# Patient Record
Sex: Female | Born: 1990 | Hispanic: Yes | Marital: Single | Smoking: Never smoker
Health system: Southern US, Community
[De-identification: ages and names within clinical notes are randomized; demographics above are authoritative.]

## PROBLEM LIST (undated history)

## (undated) DIAGNOSIS — E039 Hypothyroidism, unspecified: Secondary | ICD-10-CM

## (undated) DIAGNOSIS — D649 Anemia, unspecified: Secondary | ICD-10-CM

## (undated) DIAGNOSIS — R87629 Unspecified abnormal cytological findings in specimens from vagina: Secondary | ICD-10-CM

## (undated) DIAGNOSIS — B009 Herpesviral infection, unspecified: Secondary | ICD-10-CM

## (undated) HISTORY — PX: WISDOM TOOTH EXTRACTION: SHX21

## (undated) HISTORY — PX: TONSILLECTOMY: SUR1361

## (undated) HISTORY — DX: Unspecified abnormal cytological findings in specimens from vagina: R87.629

---

## 2012-08-18 ENCOUNTER — Encounter (HOSPITAL_COMMUNITY): Payer: Self-pay | Admitting: Nurse Practitioner

## 2012-08-18 ENCOUNTER — Emergency Department (HOSPITAL_COMMUNITY)
Admission: EM | Admit: 2012-08-18 | Discharge: 2012-08-18 | Disposition: A | Discharge status: Home / Self Care | Payer: BC Managed Care – PPO | Attending: Emergency Medicine | Admitting: Emergency Medicine

## 2012-08-18 ENCOUNTER — Emergency Department (HOSPITAL_COMMUNITY): Payer: BC Managed Care – PPO

## 2012-08-18 DIAGNOSIS — N949 Unspecified condition associated with female genital organs and menstrual cycle: Secondary | ICD-10-CM | POA: Insufficient documentation

## 2012-08-18 DIAGNOSIS — R102 Pelvic and perineal pain: Secondary | ICD-10-CM

## 2012-08-18 DIAGNOSIS — R300 Dysuria: Secondary | ICD-10-CM | POA: Insufficient documentation

## 2012-08-18 DIAGNOSIS — Z3202 Encounter for pregnancy test, result negative: Secondary | ICD-10-CM | POA: Insufficient documentation

## 2012-08-18 DIAGNOSIS — N898 Other specified noninflammatory disorders of vagina: Secondary | ICD-10-CM | POA: Insufficient documentation

## 2012-08-18 DIAGNOSIS — R109 Unspecified abdominal pain: Secondary | ICD-10-CM | POA: Insufficient documentation

## 2012-08-18 LAB — WET PREP, GENITAL
Clue Cells Wet Prep HPF POC: NONE SEEN
Trich, Wet Prep: NONE SEEN
Yeast Wet Prep HPF POC: NONE SEEN

## 2012-08-18 LAB — URINALYSIS, ROUTINE W REFLEX MICROSCOPIC
Glucose, UA: NEGATIVE mg/dL
Specific Gravity, Urine: 1.02 (ref 1.005–1.030)
pH: 5.5 (ref 5.0–8.0)

## 2012-08-18 LAB — URINE MICROSCOPIC-ADD ON

## 2012-08-18 LAB — PREGNANCY, URINE: Preg Test, Ur: NEGATIVE

## 2012-08-18 MED ORDER — HYDROCODONE-ACETAMINOPHEN 5-325 MG PO TABS: 2.0000 | ORAL_TABLET | ORAL | Status: DC | PRN

## 2012-08-18 MED ORDER — AZITHROMYCIN 250 MG PO TABS
1000.0000 mg | ORAL_TABLET | Freq: Once | ORAL | Status: AC
  Administered 2012-08-18: 1000 mg via ORAL
  Filled 2012-08-18: qty 4

## 2012-08-18 MED ORDER — CEFTRIAXONE SODIUM 250 MG IJ SOLR
250.0000 mg | Freq: Once | INTRAMUSCULAR | Status: AC
  Administered 2012-08-18: 250 mg via INTRAMUSCULAR
  Filled 2012-08-18: qty 250

## 2012-08-18 MED ORDER — ACETAMINOPHEN 325 MG PO TABS
650.0000 mg | ORAL_TABLET | Freq: Once | ORAL | Status: AC
  Administered 2012-08-18: 650 mg via ORAL
  Filled 2012-08-18: qty 2

## 2012-08-18 MED ORDER — IBUPROFEN 400 MG PO TABS
400.0000 mg | ORAL_TABLET | Freq: Once | ORAL | Status: AC
  Administered 2012-08-18: 400 mg via ORAL
  Filled 2012-08-18: qty 1

## 2012-08-18 NOTE — ED Notes (Addendum)
Pt does not appear to have signs and symptoms of allergic reaction to medication given.

## 2012-08-18 NOTE — ED Notes (Signed)
Pt denies pain currently; pt denies nausea; pt mentating appropriately.

## 2012-08-18 NOTE — ED Provider Notes (Signed)
History     CSN: [id number]  Arrival date & time 08/18/12  1554   First MD Initiated Contact with Patient 08/18/12 1726      Chief Complaint  Patient presents with  . Pelvic Pain    (Consider location/radiation/quality/duration/timing/severity/associated sxs/prior treatment) Patient is a 22 y.o. female presenting with pelvic pain. The history is provided by the patient.  Pelvic Pain This is a new problem. Episode onset: 3 days ago. The problem occurs constantly. The problem has been gradually worsening. Associated symptoms include abdominal pain. Associated symptoms comments: Vaginal discharge, dysuria. The symptoms are aggravated by intercourse and walking. Nothing relieves the symptoms. She has tried acetaminophen for the symptoms. The treatment provided no relief.    History reviewed. No pertinent past medical history.  Past Surgical History  Procedure Date  . Tonsillectomy     History reviewed. No pertinent family history.  History  Substance Use Topics  . Smoking status: Never Smoker   . Smokeless tobacco: Not on file  . Alcohol Use: Yes    OB History    Grav Para Term Preterm Abortions TAB SAB Ect Mult Living                  Review of Systems  Constitutional: Negative for fever.  Gastrointestinal: Positive for abdominal pain. Negative for nausea and vomiting.  Genitourinary: Positive for pelvic pain.  All other systems reviewed and are negative.    Allergies  Review of patient's allergies indicates no known allergies.  Home Medications  No current outpatient prescriptions on file.  BP 108/72  Pulse 85  Temp 98 F (36.7 C) (Oral)  Resp 16  SpO2 100%  LMP 08/05/2012  Physical Exam  Nursing note and vitals reviewed. Constitutional: She is oriented to person, place, and time. She appears well-developed and well-nourished. No distress.  HENT:  Head: Normocephalic and atraumatic.  Mouth/Throat: Oropharynx is clear and moist.  Eyes: Conjunctivae  normal and EOM are normal. Pupils are equal, round, and reactive to light.  Neck: Normal range of motion. Neck supple.  Cardiovascular: Normal rate, regular rhythm and intact distal pulses.   No murmur heard. Pulmonary/Chest: Effort normal and breath sounds normal. No respiratory distress. She has no wheezes. She has no rales.  Abdominal: Soft. She exhibits no distension. There is tenderness in the suprapubic area. There is no rebound and no guarding.         Bilateral inguinal lymphadenopathy  Genitourinary: Uterus is tender. Cervix exhibits discharge and friability. Cervix exhibits no motion tenderness. Right adnexum displays tenderness. Right adnexum displays no mass. Left adnexum displays tenderness. Left adnexum displays no mass. There is tenderness around the vagina. Vaginal discharge found.  Musculoskeletal: Normal range of motion. She exhibits no edema and no tenderness.  Neurological: She is alert and oriented to person, place, and time.  Skin: Skin is warm and dry. No rash noted. No erythema.  Psychiatric: She has a normal mood and affect. Her behavior is normal.    ED Course  Procedures (including critical care time)  Labs Reviewed  URINALYSIS, ROUTINE W REFLEX MICROSCOPIC - Abnormal; Notable for the following:    APPearance CLOUDY (*)     Hgb urine dipstick SMALL (*)     Ketones, ur 15 (*)     Leukocytes, UA LARGE (*)     All other components within normal limits  WET PREP, GENITAL - Abnormal; Notable for the following:    WBC, Wet Prep HPF POC MODERATE (*)  All other components within normal limits  URINE MICROSCOPIC-ADD ON - Abnormal; Notable for the following:    Squamous Epithelial / LPF FEW (*)     Bacteria, UA MANY (*)     All other components within normal limits  PREGNANCY, URINE  GC/CHLAMYDIA PROBE AMP  URINE CULTURE   No results found.   No diagnosis found.    MDM  Patient with pelvic pain, discharge and more focal pain on the left side. She also  is complaining of dysuria. Normal menses and is currently taking birth control pills. She has unprotected sex with one partner and no prior history of STDs. UA, wet prep, UPT and GC Chlamydia done. Concern for possible tubo-ovarian abscess versus ovarian cyst. Transvaginal ultrasound pending.  7:06 PM Moderate white blood cells with large leukocytes and a contaminated urine are most suggestive of STI.  Ultrasound pending. Patient will be treated with doxy if [place] is wnl.      Gwyneth Sprout, MD 08/18/12 2001

## 2012-08-18 NOTE — ED Notes (Signed)
Pt ambulatory leaving ED by self. Pt given d/c teaching and prescription. Pt verbalized understanding of instructions and follow-up care. Pt has no further questions upon d/c. Pt does not appear to be in acute distress upon d/c.

## 2012-08-18 NOTE — ED Provider Notes (Addendum)
Patient complains of pelvic pain and vaginal discharge. We'll treat empirically for STDs. Cultures pending. Prescription Norco. Followup with gynecologist next week. Safe sex encouraged Results for orders placed during the hospital encounter of 08/18/12  URINALYSIS, ROUTINE W REFLEX MICROSCOPIC      Component Value Range   Color, Urine YELLOW  YELLOW   APPearance CLOUDY (*) CLEAR   Specific Gravity, Urine 1.020  1.005 - 1.030   pH 5.5  5.0 - 8.0   Glucose, UA NEGATIVE  NEGATIVE mg/dL   Hgb urine dipstick SMALL (*) NEGATIVE   Bilirubin Urine NEGATIVE  NEGATIVE   Ketones, ur 15 (*) NEGATIVE mg/dL   Protein, ur NEGATIVE  NEGATIVE mg/dL   Urobilinogen, UA 0.2  0.0 - 1.0 mg/dL   Nitrite NEGATIVE  NEGATIVE   Leukocytes, UA LARGE (*) NEGATIVE  PREGNANCY, URINE      Component Value Range   Preg Test, Ur NEGATIVE  NEGATIVE  WET PREP, GENITAL      Component Value Range   Yeast Wet Prep HPF POC NONE SEEN  NONE SEEN   Trich, Wet Prep NONE SEEN  NONE SEEN   Clue Cells Wet Prep HPF POC NONE SEEN  NONE SEEN   WBC, Wet Prep HPF POC MODERATE (*) NONE SEEN  URINE MICROSCOPIC-ADD ON      Component Value Range   Squamous Epithelial / LPF FEW (*) RARE   WBC, UA 11-20  <3 WBC/hpf   RBC / HPF 3-6  <3 RBC/hpf   Bacteria, UA MANY (*) RARE   US Transvaginal Non-ob  08/18/2012  *RADIOLOGY REPORT*  Clinical Data:  Bilateral pelvic pain.  Clinical concern for a cyst or tubovarian abscess on the left.  TRANSABDOMINAL AND TRANSVAGINAL ULTRASOUND OF PELVIS DOPPLER ULTRASOUND OF OVARIES  Technique:  Both transabdominal and transvaginal ultrasound examinations of the pelvis were performed. Transabdominal technique was performed for global imaging of the pelvis including uterus, ovaries, adnexal regions, and pelvic cul-de-sac.  It was necessary to proceed with endovaginal exam following the transabdominal exam to visualize the right ovary and to visualize the uterus and left ovary in better detail.  Color and duplex  Doppler ultrasound was utilized to evaluate blood flow to the ovaries.  Comparison:  None.  Findings:  Uterus:  Normal in size and appearance  Endometrium:  Normal in thickness and appearance  Right ovary: Normal appearance/no adnexal mass  Left ovary:   Normal appearance/no adnexal mass  Pulsed Doppler evaluation demonstrates normal low-resistance arterial and venous waveforms in both ovaries.  Trace amount of free peritoneal fluid, within normal limits of physiological fluid.  IMPRESSION: Normal exam.  No evidence of pelvic mass or other significant abnormality.  No sonographic evidence for ovarian torsion.   Original Report Authenticated By: Beckie Salts, M.D.    US Pelvis Complete  08/18/2012  *RADIOLOGY REPORT*  Clinical Data:  Bilateral pelvic pain.  Clinical concern for a cyst or tubovarian abscess on the left.  TRANSABDOMINAL AND TRANSVAGINAL ULTRASOUND OF PELVIS DOPPLER ULTRASOUND OF OVARIES  Technique:  Both transabdominal and transvaginal ultrasound examinations of the pelvis were performed. Transabdominal technique was performed for global imaging of the pelvis including uterus, ovaries, adnexal regions, and pelvic cul-de-sac.  It was necessary to proceed with endovaginal exam following the transabdominal exam to visualize the right ovary and to visualize the uterus and left ovary in better detail.  Color and duplex Doppler ultrasound was utilized to evaluate blood flow to the ovaries.  Comparison:  None.  Findings:  Uterus:  Normal in size and appearance  Endometrium:  Normal in thickness and appearance  Right ovary: Normal appearance/no adnexal mass  Left ovary:   Normal appearance/no adnexal mass  Pulsed Doppler evaluation demonstrates normal low-resistance arterial and venous waveforms in both ovaries.  Trace amount of free peritoneal fluid, within normal limits of physiological fluid.  IMPRESSION: Normal exam.  No evidence of pelvic mass or other significant abnormality.  No sonographic evidence  for ovarian torsion.   Original Report Authenticated By: Beckie Salts, M.D.    [place] Art/ven Flow Abd Pelv Doppler  08/18/2012  *RADIOLOGY REPORT*  Clinical Data:  Bilateral pelvic pain.  Clinical concern for a cyst or tubovarian abscess on the left.  TRANSABDOMINAL AND TRANSVAGINAL ULTRASOUND OF PELVIS DOPPLER ULTRASOUND OF OVARIES  Technique:  Both transabdominal and transvaginal ultrasound examinations of the pelvis were performed. Transabdominal technique was performed for global imaging of the pelvis including uterus, ovaries, adnexal regions, and pelvic cul-de-sac.  It was necessary to proceed with endovaginal exam following the transabdominal exam to visualize the right ovary and to visualize the uterus and left ovary in better detail.  Color and duplex Doppler ultrasound was utilized to evaluate blood flow to the ovaries.  Comparison:  None.  Findings:  Uterus:  Normal in size and appearance  Endometrium:  Normal in thickness and appearance  Right ovary: Normal appearance/no adnexal mass  Left ovary:   Normal appearance/no adnexal mass  Pulsed Doppler evaluation demonstrates normal low-resistance arterial and venous waveforms in both ovaries.  Trace amount of free peritoneal fluid, within normal limits of physiological fluid.  IMPRESSION: Normal exam.  No evidence of pelvic mass or other significant abnormality.  No sonographic evidence for ovarian torsion.   Original Report Authenticated By: Beckie Salts, M.D.    Dx pelvic pain  Doug Sou, MD 08/18/12 7829  Doug Sou, MD 08/18/12 2134

## 2012-08-18 NOTE — ED Notes (Signed)
C/o mild pelvic pain onset Thursday that has become severe today. Denies n/v. Reports thick white vag discharge.

## 2012-08-20 LAB — URINE CULTURE: Colony Count: >=100000

## 2012-08-25 NOTE — ED Notes (Signed)
+   Urine Chart sent to EDP office for review. 

## 2012-08-25 NOTE — ED Notes (Signed)
Rx called in to [facility] [phone]) by Norm Parcel PFM.

## 2012-08-25 NOTE — ED Notes (Signed)
Chart returned from EDP office. Prescribed Cipro 250 mg PO BID. #6. No refills. Prescribed by Hannah Muthersbaugh PA-C. °

## 2013-01-17 LAB — OB RESULTS CONSOLE RUBELLA ANTIBODY, IGM: RUBELLA: IMMUNE

## 2013-01-17 LAB — OB RESULTS CONSOLE ABO/RH: RH Type: POSITIVE

## 2013-01-17 LAB — OB RESULTS CONSOLE HEPATITIS B SURFACE ANTIGEN: HEP B S AG: NEGATIVE

## 2013-02-06 LAB — OB RESULTS CONSOLE HIV ANTIBODY (ROUTINE TESTING)
HIV: NONREACTIVE
HIV: NONREACTIVE

## 2013-02-06 LAB — OB RESULTS CONSOLE RPR: RPR: NONREACTIVE

## 2013-02-06 LAB — OB RESULTS CONSOLE ABO/RH: RH TYPE: POSITIVE

## 2013-02-06 LAB — OB RESULTS CONSOLE HEPATITIS B SURFACE ANTIGEN: HEP B S AG: NEGATIVE

## 2013-02-06 LAB — OB RESULTS CONSOLE RUBELLA ANTIBODY, IGM: Rubella: IMMUNE

## 2013-02-06 LAB — OB RESULTS CONSOLE ANTIBODY SCREEN: Antibody Screen: NEGATIVE

## 2013-02-13 LAB — OB RESULTS CONSOLE GC/CHLAMYDIA
CHLAMYDIA, DNA PROBE: NEGATIVE
Chlamydia: NEGATIVE
Gonorrhea: NEGATIVE
Gonorrhea: NEGATIVE

## 2013-06-17 LAB — OB RESULTS CONSOLE RPR: RPR: NONREACTIVE

## 2013-08-15 NOTE — L&D Delivery Note (Signed)
Delivery Note At 11:50 PM a viable female was delivered via Vaginal, Spontaneous Delivery (Presentation: ; Occiput Anterior).  APGAR: 9, 9; weight .   Placenta status: Intact, Spontaneous.  Cord: 3 vessels with the following complications: None.  Cord pH: not indicated  Anesthesia: Epidural  Episiotomy: None Lacerations: small second degree perineal, external to hymenal band repaired w/ several figure of 8 sutures of 3-0 vicryl rapide and L sulcal w/ extension to L labia minora Suture Repair: 3.0 vicryl rapide Est. Blood Loss (mL): 450  Mom to postpartum.  Baby to Couplet care / Skin to Skin.  Alanna Storti A. 09/09/2013, 12:22 AM

## 2013-08-18 LAB — OB RESULTS CONSOLE GC/CHLAMYDIA
Chlamydia: NEGATIVE
GC PROBE AMP, GENITAL: NEGATIVE

## 2013-08-23 ENCOUNTER — Inpatient Hospital Stay (HOSPITAL_COMMUNITY)
Admission: AD | Admit: 2013-08-23 | Discharge: 2013-08-23 | Disposition: A | Discharge status: Home / Self Care | Payer: PRIVATE HEALTH INSURANCE | Source: Ambulatory Visit | Attending: Obstetrics & Gynecology | Admitting: Obstetrics & Gynecology

## 2013-08-23 ENCOUNTER — Encounter (HOSPITAL_COMMUNITY): Payer: Self-pay | Admitting: *Deleted

## 2013-08-23 DIAGNOSIS — O2390 Unspecified genitourinary tract infection in pregnancy, unspecified trimester: Secondary | ICD-10-CM | POA: Insufficient documentation

## 2013-08-23 DIAGNOSIS — B3731 Acute candidiasis of vulva and vagina: Secondary | ICD-10-CM | POA: Insufficient documentation

## 2013-08-23 DIAGNOSIS — O4700 False labor before 37 completed weeks of gestation, unspecified trimester: Secondary | ICD-10-CM | POA: Insufficient documentation

## 2013-08-23 DIAGNOSIS — B373 Candidiasis of vulva and vagina: Secondary | ICD-10-CM | POA: Insufficient documentation

## 2013-08-23 DIAGNOSIS — R109 Unspecified abdominal pain: Secondary | ICD-10-CM | POA: Insufficient documentation

## 2013-08-23 DIAGNOSIS — O479 False labor, unspecified: Secondary | ICD-10-CM

## 2013-08-23 HISTORY — DX: Herpesviral infection, unspecified: B00.9

## 2013-08-23 MED ORDER — TERCONAZOLE 0.4 % VA CREA: 1.0000 | TOPICAL_CREAM | Freq: Every day | VAGINAL | Status: DC

## 2013-08-23 NOTE — MAU Provider Note (Signed)
  History     CSN: [id number]  Arrival date and time: 08/23/13 1820 Nurse call to provider @1918  Provider here to examine patient @ 2005     Chief Complaint  Patient presents with  . Labor Eval   HPI  Cramping - ctx every 2 minutes last few seconds Leaking fluid -discharge Denies any vaginal itching or odor Constant pressure  Past Medical History  Diagnosis Date  . Herpes     Past Surgical History  Procedure Laterality Date  . Tonsillectomy    . Wisdom tooth extraction      History reviewed. No pertinent family history.  History  Substance Use Topics  . Smoking status: Never Smoker   . Smokeless tobacco: Not on file  . Alcohol Use: Yes    Allergies: No Known Allergies  Prescriptions prior to admission  Medication Sig Dispense Refill  . IRON PO Take 1 tablet by mouth daily.      . Prenatal Vit-Fe Fumarate-FA (PRENATAL MULTIVITAMIN) TABS tablet Take 1 tablet by mouth daily.      . valACYclovir (VALTREX) 500 MG tablet Take 500 mg by mouth every evening.        ROS Physical Exam   Blood pressure 110/69, pulse 87, temperature 97.9 F (36.6 C), temperature source Oral, resp. rate 18, height 5\' 5"  (1.651 m), weight 83.008 kg (183 lb).  Physical Exam Alert and oriented - NAD or pain Abdomen soft and non-tender External genitalia with marked amount thick white discharge Sterile spec exam - no pooling / marked amount white discharge  Ferning negative Hyphae on slide  NST reactive  toco - no ctx  MAU Course  Procedures  Assessment and Plan  36.[redacted] weeks pregnant No evidence of ROM or labor yeast vaginitis  1) dc home - instructions to always call before trip to hospital 2) labor precautions - ctx versus cramping / vaginal discharge versus amniotic fluid with ROM 3) treat yeast with 7 days of Terazol cream  [name], [name] 08/23/2013, 8:12 PM

## 2013-08-23 NOTE — MAU Note (Signed)
Was at work, started feeling a lot pressure and cramping. Noted ? Trickling when menstrual cramping started around 1530.

## 2013-08-23 NOTE — Discharge Instructions (Signed)
Braxton Hicks Contractions Pregnancy is commonly associated with contractions of the uterus throughout the pregnancy. Towards the end of pregnancy, these contractions [facility](Braxton Willa Rough[place]icks) can develop more often and may become more forceful. This is not true labor because these contractions do not result in opening (dilatation) and thinning of the cervix.   How to tell the difference between true and false labor: False labor:  The contractions are usually shorter, irregular and not as hard as those of true labor.  They are often felt in the front of the lower abdomen and in the groin.  They may stop with walking around or changing positions or lying down or increased water intake or tub soaks.  These contractions are usually irregular and will eventually space out and stop.  They do not become progressively stronger, regular and closer together.   True labor:  Contractions in true labor last 60 to 90 seconds, become very regular, usually become more intense, and increase in frequency over time.  They do not go away with walking or resting or tub soaks.  The discomfort is usually felt in the top of the uterus and spreads to the lower abdomen and low back.  True labor will show that the cervix is dilating and getting thinner.   HOME CARE INSTRUCTIONS   Keep up with your usual exercises and instructions.  Take medications as directed.  Keep your regular prenatal appointment.  If BH contractions are making you uncomfortable: Change your activity position from lying down or resting to walking/walking to resting.  Soak and rest in a tub of warm water for 20-30 minutes  Drink 2 to 3 glasses of water if having cramps or mild contractions.   Drink water all day - 6 to 8 servings per day & keep bladder empty to prevent intense cramps. SEEK IMMEDIATE MEDICAL CARE IF:   Your contractions continue to become stronger, more regular, and closer together.  You have a gushing, burst or leaking  of fluid from the vagina - more than a cup of water.  An oral temperature above 102 F (38.9 C).  You have passage of blood-tinged mucus.  You develop vaginal bleeding.  You develop continuous belly (abdominal) severe pain.  The baby is not moving or a significant decrease in fetal activity.   Document Released: 08/01/2005 Document Revised: 10/24/2011 Document Reviewed: 01/23/2009 [facility] Patient Information 2014 [place], [place].

## 2013-08-27 LAB — OB RESULTS CONSOLE GBS
GBS: POSITIVE
STREP GROUP B AG: POSITIVE

## 2013-09-05 ENCOUNTER — Inpatient Hospital Stay (HOSPITAL_COMMUNITY)
Admission: AD | Admit: 2013-09-05 | Discharge: 2013-09-05 | Disposition: A | Discharge status: Home / Self Care | Payer: PRIVATE HEALTH INSURANCE | Source: Ambulatory Visit | Attending: Obstetrics & Gynecology | Admitting: Obstetrics & Gynecology

## 2013-09-05 ENCOUNTER — Encounter (HOSPITAL_COMMUNITY): Payer: Self-pay | Admitting: *Deleted

## 2013-09-05 DIAGNOSIS — R109 Unspecified abdominal pain: Secondary | ICD-10-CM | POA: Insufficient documentation

## 2013-09-05 DIAGNOSIS — O99891 Other specified diseases and conditions complicating pregnancy: Secondary | ICD-10-CM | POA: Insufficient documentation

## 2013-09-05 DIAGNOSIS — H538 Other visual disturbances: Secondary | ICD-10-CM | POA: Insufficient documentation

## 2013-09-05 DIAGNOSIS — O9989 Other specified diseases and conditions complicating pregnancy, childbirth and the puerperium: Principal | ICD-10-CM

## 2013-09-05 DIAGNOSIS — G44219 Episodic tension-type headache, not intractable: Secondary | ICD-10-CM

## 2013-09-05 DIAGNOSIS — G44209 Tension-type headache, unspecified, not intractable: Secondary | ICD-10-CM | POA: Insufficient documentation

## 2013-09-05 DIAGNOSIS — O471 False labor at or after 37 completed weeks of gestation: Secondary | ICD-10-CM

## 2013-09-05 LAB — COMPREHENSIVE METABOLIC PANEL
ALBUMIN: 3 g/dL — AB (ref 3.5–5.2)
ALT: 27 U/L (ref 0–35)
AST: 24 U/L (ref 0–37)
Alkaline Phosphatase: 129 U/L — ABNORMAL HIGH (ref 39–117)
BUN: 7 mg/dL (ref 6–23)
CALCIUM: 9.1 mg/dL (ref 8.4–10.5)
CO2: 21 meq/L (ref 19–32)
Chloride: 102 mEq/L (ref 96–112)
Creatinine, Ser: 0.47 mg/dL — ABNORMAL LOW (ref 0.50–1.10)
GFR calc Af Amer: >90 mL/min (ref >90)
GFR calc non Af Amer: >90 mL/min (ref >90)
Glucose, Bld: 82 mg/dL (ref 70–99)
Potassium: 3.8 mEq/L (ref 3.7–5.3)
SODIUM: 137 meq/L (ref 137–147)
TOTAL PROTEIN: 6.8 g/dL (ref 6.0–8.3)
Total Bilirubin: <0.2 mg/dL — ABNORMAL LOW (ref 0.3–1.2)

## 2013-09-05 LAB — URINALYSIS, ROUTINE W REFLEX MICROSCOPIC
BILIRUBIN URINE: NEGATIVE
Glucose, UA: NEGATIVE mg/dL
Ketones, ur: NEGATIVE mg/dL
NITRITE: NEGATIVE
Protein, ur: NEGATIVE mg/dL
SPECIFIC GRAVITY, URINE: 1.015 (ref 1.005–1.030)
UROBILINOGEN UA: 0.2 mg/dL (ref 0.0–1.0)
pH: 7.5 (ref 5.0–8.0)

## 2013-09-05 LAB — URINE MICROSCOPIC-ADD ON

## 2013-09-05 LAB — CBC
HCT: 33.4 % — ABNORMAL LOW (ref 36.0–46.0)
Hemoglobin: 11.2 g/dL — ABNORMAL LOW (ref 12.0–15.0)
MCH: 28.8 pg (ref 26.0–34.0)
MCHC: 33.5 g/dL (ref 30.0–36.0)
MCV: 85.9 fL (ref 78.0–100.0)
PLATELETS: 264 10*3/uL (ref 150–400)
RBC: 3.89 MIL/uL (ref 3.87–5.11)
RDW: 13.5 % (ref 11.5–15.5)
WBC: 8.2 10*3/uL (ref 4.0–10.5)

## 2013-09-05 LAB — URIC ACID: Uric Acid, Serum: 3.8 mg/dL (ref 2.4–7.0)

## 2013-09-05 MED ORDER — ACETAMINOPHEN 500 MG PO TABS
1000.0000 mg | ORAL_TABLET | Freq: Once | ORAL | Status: AC
  Administered 2013-09-05: 1000 mg via ORAL
  Filled 2013-09-05: qty 2

## 2013-09-05 NOTE — MAU Note (Signed)
Patient states she has been having lower abdominal pressure and a headache today. Denies bleeding or leaking. Reports good fetal movement. Denies flu like symptoms.

## 2013-09-05 NOTE — MAU Provider Note (Signed)
History     CSN: [id number]  Arrival date and time: 09/05/13 1747 Provider on unit @ 1815 Provider notified @ 1820 Provider at bedside @ 1835   Chief Complaint  Patient presents with  . Headache  . Abdominal Pain   HPI  [name] is a 23 yo G1P0 at 38.5wks presenting today with c/o headache, blurry vision and lower  abdominal pain/pressure.  She reports that the abdominal pressure/pain has been going on since last night. Her headache started this afternoon while at work.  She remained at work, took nothing for the pain and did not have BP taken.  She was seen in the office yesterday - VE: 1/50%.  She reports (+) FM and some brownish spotting that occurs with wiping since seeing Dr. Juliene Pina yesterday.  Denies LOF.  Past Medical History  Diagnosis Date  . Herpes     Past Surgical History  Procedure Laterality Date  . Tonsillectomy    . Wisdom tooth extraction      No family history on file.  History  Substance Use Topics  . Smoking status: Never Smoker   . Smokeless tobacco: Not on file  . Alcohol Use: Yes    Allergies: No Known Allergies .meds Prescriptions prior to admission  Medication Sig Dispense Refill  . IRON PO Take 1 tablet by mouth daily.      . Prenatal Vit-Fe Fumarate-FA (PRENATAL MULTIVITAMIN) TABS tablet Take 1 tablet by mouth daily.      Marland Kitchen terconazole (TERAZOL 7) 0.4 % vaginal cream Place 1 applicator vaginally at bedtime.  45 g  0  . valACYclovir (VALTREX) 500 MG tablet Take 500 mg by mouth every evening.        Review of Systems  Constitutional: Negative.   HENT:       Located at base of skull, up the back of head and frontal  Eyes: Positive for blurred vision.       Since start of h/a  Respiratory: Negative.   Cardiovascular: Negative.   Gastrointestinal: Negative.   Genitourinary:       Brownish spotting with wiping  Musculoskeletal: Negative.   Skin: Negative.   Neurological: Positive for headaches.  Endo/Heme/Allergies:  Negative.   Psychiatric/Behavioral: Negative.     Results for orders placed during the hospital encounter of 09/05/13 (from the past 24 hour(s))  URINALYSIS, ROUTINE W REFLEX MICROSCOPIC     Status: Abnormal   Collection Time    09/05/13  6:20 PM      Result Value Range   Color, Urine YELLOW  YELLOW   APPearance CLEAR  CLEAR   Specific Gravity, Urine 1.015  1.005 - 1.030   pH 7.5  5.0 - 8.0   Glucose, UA NEGATIVE  NEGATIVE mg/dL   Hgb urine dipstick SMALL (*) NEGATIVE   Bilirubin Urine NEGATIVE  NEGATIVE   Ketones, ur NEGATIVE  NEGATIVE mg/dL   Protein, ur NEGATIVE  NEGATIVE mg/dL   Urobilinogen, UA 0.2  0.0 - 1.0 mg/dL   Nitrite NEGATIVE  NEGATIVE   Leukocytes, UA SMALL (*) NEGATIVE  URINE MICROSCOPIC-ADD ON     Status: Abnormal   Collection Time    09/05/13  6:20 PM      Result Value Range   Squamous Epithelial / LPF FEW (*) RARE   WBC, UA 0-2  <3 WBC/hpf   RBC / HPF 0-2  <3 RBC/hpf   Bacteria, UA FEW (*) RARE  CBC     Status: Abnormal   Collection  Time    09/05/13  6:38 PM      Result Value Range   WBC 8.2  4.0 - 10.5 K/uL   RBC 3.89  3.87 - 5.11 MIL/uL   Hemoglobin 11.2 (*) 12.0 - 15.0 g/dL   HCT 16.133.4 (*) 09.636.0 - 04.546.0 %   MCV 85.9  78.0 - 100.0 fL   MCH 28.8  26.0 - 34.0 pg   MCHC 33.5  30.0 - 36.0 g/dL   RDW 40.913.5  81.111.5 - 91.415.5 %   Platelets 264  150 - 400 K/uL  COMPREHENSIVE METABOLIC PANEL     Status: Abnormal   Collection Time    09/05/13  6:38 PM      Result Value Range   Sodium 137  137 - 147 mEq/L   Potassium 3.8  3.7 - 5.3 mEq/L   Chloride 102  96 - 112 mEq/L   CO2 21  19 - 32 mEq/L   Glucose, Bld 82  70 - 99 mg/dL   BUN 7  6 - 23 mg/dL   Creatinine, Ser 7.820.47 (*) 0.50 - 1.10 mg/dL   Calcium 9.1  8.4 - 95.610.5 mg/dL   Total Protein 6.8  6.0 - 8.3 g/dL   Albumin 3.0 (*) 3.5 - 5.2 g/dL   AST 24  0 - 37 U/L   ALT 27  0 - 35 U/L   Alkaline Phosphatase 129 (*) 39 - 117 U/L   Total Bilirubin <0.2 (*) 0.3 - 1.2 mg/dL   GFR calc non Af Amer >90  >90 mL/min    GFR calc Af Amer >90  >90 mL/min  URIC ACID     Status: None   Collection Time    09/05/13  6:38 PM      Result Value Range   Uric Acid, Serum 3.8  2.4 - 7.0 mg/dL   Physical Exam   Blood pressure 107/66, pulse 85, temperature 98.9 F (37.2 C), temperature source Oral, resp. rate 16, height 5\' 6"  (1.676 m), weight 84.369 kg (186 lb), SpO2 100.00%.  Physical Exam  Constitutional: She is oriented to person, place, and time. She appears well-developed and well-nourished.  HENT:  Head: Normocephalic and atraumatic.  Eyes: EOM are normal. Pupils are equal, round, and reactive to light.  Neck: Normal range of motion. Neck supple.  Cardiovascular: Normal rate, regular rhythm, normal heart sounds and intact distal pulses.   Respiratory: Effort normal and breath sounds normal.  GI: Soft. Bowel sounds are normal.  Genitourinary: Vagina normal.  Gravid uterus  Musculoskeletal: Normal range of motion.  Neurological: She is alert and oriented to person, place, and time. She has normal reflexes.  Skin: Skin is warm and dry.  Psychiatric: She has a normal mood and affect. Her behavior is normal. Judgment and thought content normal.  VE: 1/50/-3/vtx NST: Reactive  MAU Course  Procedures CCUA CBC CMET Uric Acid Serial BPs  Assessment and Plan  23 yo G1P0 SIUP @ 38.5wks  R/O PIH - labs WNL Tension Headache  Category 1 FHR  1) Tylenol 1000 mg po now 2) Discharge Home 3) Labor Precautions/Fetal Kick Counts given 4) Continue good po hydration 5) Call office if symptoms worsen 6) Keep scheduled appointment   * Dr. Seymour BarsLavoie notified of plan / agrees with plan  Kenard GowerAWSON, Juliyah Mergen, M, MSN, CNM 09/05/2013, 6:37 PM

## 2013-09-05 NOTE — Discharge Instructions (Signed)
Braxton Hicks Contractions Pregnancy is commonly associated with contractions of the uterus throughout the pregnancy. Towards the end of pregnancy (32 to 34 weeks), these contractions [facility] Willa Rough) can develop more often and may become more forceful. This is not true labor because these contractions do not result in opening (dilatation) and thinning of the cervix. They are sometimes difficult to tell apart from true labor because these contractions can be forceful and people have different pain tolerances. You should not feel embarrassed if you go to the hospital with false labor. Sometimes, the only way to tell if you are in true labor is for your caregiver to follow the changes in the cervix. How to tell the difference between true and false labor:  False labor.  The contractions of false labor are usually shorter, irregular and not as hard as those of true labor.  They are often felt in the front of the lower abdomen and in the groin.  They may leave with walking around or changing positions while lying down.  They get weaker and are shorter lasting as time goes on.  These contractions are usually irregular.  They do not usually become progressively stronger, regular and closer together as with true labor.  True labor.  Contractions in true labor last 30 to 70 seconds, become very regular, usually become more intense, and increase in frequency.  They do not go away with walking.  The discomfort is usually felt in the top of the uterus and spreads to the lower abdomen and low back.  True labor can be determined by your caregiver with an exam. This will show that the cervix is dilating and getting thinner. If there are no prenatal problems or other health problems associated with the pregnancy, it is completely safe to be sent home with false labor and await the onset of true labor. HOME CARE INSTRUCTIONS   Keep up with your usual exercises and instructions.  Take medications as  directed.  Keep your regular prenatal appointment.  Eat and drink lightly if you think you are going into labor.  If BH contractions are making you uncomfortable:  Change your activity position from lying down or resting to walking/walking to resting.  Sit and rest in a tub of warm water.  Drink 2 to 3 glasses of water. Dehydration may cause B-H contractions.  Do slow and deep breathing several times an hour. SEEK IMMEDIATE MEDICAL CARE IF:   Your contractions continue to become stronger, more regular, and closer together.  You have a gushing, burst or leaking of fluid from the vagina.  An oral temperature above 102 F (38.9 C) develops.  You have passage of blood-tinged mucus.  You develop vaginal bleeding.  You develop continuous belly (abdominal) pain.  You have low back pain that you never had before.  You feel the baby's head pushing down causing pelvic pressure.  The baby is not moving as much as it used to. Document Released: 08/01/2005 Document Revised: 10/24/2011 Document Reviewed: 05/13/2013 [facility] Patient Information 2014 [place], [place].  Headaches, Frequently Asked Questions MIGRAINE HEADACHES Q: What is migraine? What causes it? How can I treat it? A: Generally, migraine headaches begin as a dull ache. Then they develop into a constant, throbbing, and pulsating pain. You may experience pain at the temples. You may experience pain at the front or back of one or both sides of the head. The pain is usually accompanied by a combination of:  Nausea.  Vomiting.  Sensitivity to light and  noise. Some people (about 15%) experience an aura (see below) before an attack. The cause of migraine is believed to be chemical reactions in the brain. Treatment for migraine may include over-the-counter or prescription medications. It may also include self-help techniques. These include relaxation training and biofeedback.  Q: What is an aura? A: About 15% of people with  migraine get an "aura". This is a sign of neurological symptoms that occur before a migraine headache. You may see wavy or jagged lines, dots, or flashing lights. You might experience tunnel vision or blind spots in one or both eyes. The aura can include visual or auditory hallucinations (something imagined). It may include disruptions in smell (such as strange odors), taste or touch. Other symptoms include:  Numbness.  A "pins and needles" sensation.  Difficulty in recalling or speaking the correct word. These neurological events may last as long as 60 minutes. These symptoms will fade as the headache begins. Q: What is a trigger? A: Certain physical or environmental factors can lead to or "trigger" a migraine. These include:  Foods.  Hormonal changes.  Weather.  Stress. It is important to remember that triggers are different for everyone. To help prevent migraine attacks, you need to figure out which triggers affect you. Keep a headache diary. This is a good way to track triggers. The diary will help you talk to your healthcare professional about your condition. Q: Does weather affect migraines? A: Bright sunshine, hot, humid conditions, and drastic changes in barometric pressure may lead to, or "trigger," a migraine attack in some people. But studies have shown that weather does not act as a trigger for everyone with migraines. Q: What is the link between migraine and hormones? A: Hormones start and regulate many of your body's functions. Hormones keep your body in balance within a constantly changing environment. The levels of hormones in your body are unbalanced at times. Examples are during menstruation, pregnancy, or menopause. That can lead to a migraine attack. In fact, about three quarters of all women with migraine report that their attacks are related to the menstrual cycle.  Q: Is there an increased risk of stroke for migraine sufferers? A: The likelihood of a migraine attack  causing a stroke is very remote. That is not to say that migraine sufferers cannot have a stroke associated with their migraines. In persons under age 25, the most common associated factor for stroke is migraine headache. But over the course of a person's normal life span, the occurrence of migraine headache may actually be associated with a reduced risk of dying from cerebrovascular disease due to stroke.  Q: What are acute medications for migraine? A: Acute medications are used to treat the pain of the headache after it has started. Examples over-the-counter medications, NSAIDs, ergots, and triptans.  Q: What are the triptans? A: Triptans are the newest class of abortive medications. They are specifically targeted to treat migraine. Triptans are vasoconstrictors. They moderate some chemical reactions in the brain. The triptans work on receptors in your brain. Triptans help to restore the balance of a neurotransmitter called serotonin. Fluctuations in levels of serotonin are thought to be a main cause of migraine.  Q: Are over-the-counter medications for migraine effective? A: Over-the-counter, or "OTC," medications may be effective in relieving mild to moderate pain and associated symptoms of migraine. But you should see your caregiver before beginning any treatment regimen for migraine.  Q: What are preventive medications for migraine? A: Preventive medications for migraine are sometimes  referred to as "prophylactic" treatments. They are used to reduce the frequency, severity, and length of migraine attacks. Examples of preventive medications include antiepileptic medications, antidepressants, beta-blockers, calcium channel blockers, and NSAIDs (nonsteroidal anti-inflammatory drugs). Q: Why are anticonvulsants used to treat migraine? A: During the past few years, there has been an increased interest in antiepileptic drugs for the prevention of migraine. They are sometimes referred to as  "anticonvulsants". Both epilepsy and migraine may be caused by similar reactions in the brain.  Q: Why are antidepressants used to treat migraine? A: Antidepressants are typically used to treat people with depression. They may reduce migraine frequency by regulating chemical levels, such as serotonin, in the brain.  Q: What alternative therapies are used to treat migraine? A: The term "alternative therapies" is often used to describe treatments considered outside the scope of conventional Western medicine. Examples of alternative therapy include acupuncture, acupressure, and yoga. Another common alternative treatment is herbal therapy. Some herbs are believed to relieve headache pain. Always discuss alternative therapies with your caregiver before proceeding. Some herbal products contain arsenic and other toxins. TENSION HEADACHES Q: What is a tension-type headache? What causes it? How can I treat it? A: Tension-type headaches occur randomly. They are often the result of temporary stress, anxiety, fatigue, or anger. Symptoms include soreness in your temples, a tightening band-like sensation around your head (a "vice-like" ache). Symptoms can also include a pulling feeling, pressure sensations, and contracting head and neck muscles. The headache begins in your forehead, temples, or the back of your head and neck. Treatment for tension-type headache may include over-the-counter or prescription medications. Treatment may also include self-help techniques such as relaxation training and biofeedback. CLUSTER HEADACHES Q: What is a cluster headache? What causes it? How can I treat it? A: Cluster headache gets its name because the attacks come in groups. The pain arrives with little, if any, warning. It is usually on one side of the head. A tearing or bloodshot eye and a runny nose on the same side of the headache may also accompany the pain. Cluster headaches are believed to be caused by chemical reactions in  the brain. They have been described as the most severe and intense of any headache type. Treatment for cluster headache includes prescription medication and oxygen. SINUS HEADACHES Q: What is a sinus headache? What causes it? How can I treat it? A: When a cavity in the bones of the face and skull (a sinus) becomes inflamed, the inflammation will cause localized pain. This condition is usually the result of an allergic reaction, a tumor, or an infection. If your headache is caused by a sinus blockage, such as an infection, you will probably have a fever. An x-ray will confirm a sinus blockage. Your caregiver's treatment might include antibiotics for the infection, as well as antihistamines or decongestants.  REBOUND HEADACHES Q: What is a rebound headache? What causes it? How can I treat it? A: A pattern of taking acute headache medications too often can lead to a condition known as "rebound headache." A pattern of taking too much headache medication includes taking it more than 2 days per week or in excessive amounts. That means more than the label or a caregiver advises. With rebound headaches, your medications not only stop relieving pain, they actually begin to cause headaches. Doctors treat rebound headache by tapering the medication that is being overused. Sometimes your caregiver will gradually substitute a different type of treatment or medication. Stopping may be a challenge. Regularly  overusing a medication increases the potential for serious side effects. Consult a caregiver if you regularly use headache medications more than 2 days per week or more than the label advises. ADDITIONAL QUESTIONS AND ANSWERS Q: What is biofeedback? A: Biofeedback is a self-help treatment. Biofeedback uses special equipment to monitor your body's involuntary physical responses. Biofeedback monitors:  Breathing.  Pulse.  Heart rate.  Temperature.  Muscle tension.  Brain activity. Biofeedback helps you  refine and perfect your relaxation exercises. You learn to control the physical responses that are related to stress. Once the technique has been mastered, you do not need the equipment any more. Q: Are headaches hereditary? A: Four out of five (80%) of people that suffer report a family history of migraine. Scientists are not sure if this is genetic or a family predisposition. Despite the uncertainty, a child has a 50% chance of having migraine if one parent suffers. The child has a 75% chance if both parents suffer.  Q: Can children get headaches? A: By the time they reach high school, most young people have experienced some type of headache. Many safe and effective approaches or medications can prevent a headache from occurring or stop it after it has begun.  Q: What type of doctor should I see to diagnose and treat my headache? A: Start with your primary caregiver. Discuss his or her experience and approach to headaches. Discuss methods of classification, diagnosis, and treatment. Your caregiver may decide to recommend you to a headache specialist, depending upon your symptoms or other physical conditions. Having diabetes, allergies, etc., may require a more comprehensive and inclusive approach to your headache. The National Head[employer] Foundation will provide, upon request, a list of [facility] physician members in your state. Document Released: 10/22/2003 Document Revised: 10/24/2011 Document Reviewed: 03/31/2008 [facility] Patient Information 2014 [place], [place].  Tension Headache A tension headache is a feeling of pain, pressure, or aching often felt over the front and sides of the head. The pain can be dull or can feel tight (constricting). It is the most common type of headache. Tension headaches are not normally associated with nausea or vomiting and do not get worse with physical activity. Tension headaches can last 30 minutes to several days.  CAUSES  The exact cause is not known, but it may be caused  by chemicals and hormones in the brain that lead to pain. Tension headaches often begin after stress, anxiety, or depression. Other triggers may include:  Alcohol.  Caffeine (too much or withdrawal).  Respiratory infections (colds, flu, sinus infections).  Dental problems or teeth clenching.  Fatigue.  Holding your head and neck in one position too long while using a computer. SYMPTOMS   Pressure around the head.   Dull, aching head pain.   Pain felt over the front and sides of the head.   Tenderness in the muscles of the head, neck, and shoulders. DIAGNOSIS  A tension headache is often diagnosed based on:   Symptoms.   Physical examination.   A CT scan or MRI of your head. These tests may be ordered if symptoms are severe or unusual. TREATMENT  Medicines may be given to help relieve symptoms.  HOME CARE INSTRUCTIONS   Only take over-the-counter or prescription medicines for pain or discomfort as directed by your caregiver.   Lie down in a dark, quiet room when you have a headache.   Keep a journal to find out what may be triggering your headaches. For example, write down:  What you  eat and drink.  How much sleep you get.  Any change to your diet or medicines.  Try massage or other relaxation techniques.   Ice packs or heat applied to the head and neck can be used. Use these 3 to 4 times per day for 15 to 20 minutes each time, or as needed.   Limit stress.   Sit up straight, and do not tense your muscles.   Quit smoking if you smoke.  Limit alcohol use.  Decrease the amount of caffeine you drink, or stop drinking caffeine.  Eat and exercise regularly.  Get 7 to 9 hours of sleep, or as recommended by your caregiver.  Avoid excessive use of pain medicine as recurrent headaches can occur.  SEEK MEDICAL CARE IF:   You have problems with the medicines you were prescribed.  Your medicines do not work.  You have a change from the usual  headache.  You have nausea or vomiting. SEEK IMMEDIATE MEDICAL CARE IF:   Your headache becomes severe.  You have a fever.  You have a stiff neck.  You have loss of vision.  You have muscular weakness or loss of muscle control.  You lose your balance or have trouble walking.  You feel faint or pass out.  You have severe symptoms that are different from your first symptoms. MAKE SURE YOU:   Understand these instructions.  Will watch your condition.  Will get help right away if you are not doing well or get worse. Document Released: 08/01/2005 Document Revised: 10/24/2011 Document Reviewed: 07/22/2011 [facility] Patient Information 2014 [place], [place]. Fetal Movement Counts Patient Name: __________________________________________________ Patient Due Date: ____________________ Performing a fetal movement count is highly recommended in high-risk pregnancies, but it is good for every pregnant woman to do. Your caregiver may ask you to start counting fetal movements at 28 weeks of the pregnancy. Fetal movements often increase:  After eating a full meal.  After physical activity.  After eating or drinking something sweet or cold.  At rest. Pay attention to when you feel the baby is most active. This will help you notice a pattern of your baby's sleep and wake cycles and what factors contribute to an increase in fetal movement. It is important to perform a fetal movement count at the same time each day when your baby is normally most active.  HOW TO COUNT FETAL MOVEMENTS 1. Find a quiet and comfortable area to sit or lie down on your left side. Lying on your left side provides the best blood and oxygen circulation to your baby. 2. Write down the day and time on a sheet of paper or in a journal. 3. Start counting kicks, flutters, swishes, rolls, or jabs in a 2 hour period. You should feel at least 10 movements within 2 hours. 4. If you do not feel 10 movements in 2 hours, wait 2 3  hours and count again. Look for a change in the pattern or not enough counts in 2 hours. SEEK MEDICAL CARE IF:  You feel less than 10 counts in 2 hours, tried twice.  There is no movement in over an hour.  The pattern is changing or taking longer each day to reach 10 counts in 2 hours.  You feel the baby is not moving as he or she usually does. Date: ____________ Movements: ____________ Start time: ____________ Doreatha Martin time: ____________  Date: ____________ Movements: ____________ Start time: ____________ Doreatha Martin time: ____________ Date: ____________ Movements: ____________ Start time: ____________ Doreatha Martin time: ____________  Date: ____________ Movements: ____________ Start time: ____________ Doreatha Martin time: ____________ Date: ____________ Movements: ____________ Start time: ____________ Doreatha Martin time: ____________ Date: ____________ Movements: ____________ Start time: ____________ Doreatha Martin time: ____________ Date: ____________ Movements: ____________ Start time: ____________ Doreatha Martin time: ____________ Date: ____________ Movements: ____________ Start time: ____________ Doreatha Martin time: ____________  Date: ____________ Movements: ____________ Start time: ____________ Doreatha Martin time: ____________ Date: ____________ Movements: ____________ Start time: ____________ Doreatha Martin time: ____________ Date: ____________ Movements: ____________ Start time: ____________ Doreatha Martin time: ____________ Date: ____________ Movements: ____________ Start time: ____________ Doreatha Martin time: ____________ Date: ____________ Movements: ____________ Start time: ____________ Doreatha Martin time: ____________ Date: ____________ Movements: ____________ Start time: ____________ Doreatha Martin time: ____________ Date: ____________ Movements: ____________ Start time: ____________ Doreatha Martin time: ____________  Date: ____________ Movements: ____________ Start time: ____________ Doreatha Martin time: ____________ Date: ____________ Movements: ____________ Start time: ____________  Doreatha Martin time: ____________ Date: ____________ Movements: ____________ Start time: ____________ Doreatha Martin time: ____________ Date: ____________ Movements: ____________ Start time: ____________ Doreatha Martin time: ____________ Date: ____________ Movements: ____________ Start time: ____________ Doreatha Martin time: ____________ Date: ____________ Movements: ____________ Start time: ____________ Doreatha Martin time: ____________ Date: ____________ Movements: ____________ Start time: ____________ Doreatha Martin time: ____________  Date: ____________ Movements: ____________ Start time: ____________ Doreatha Martin time: ____________ Date: ____________ Movements: ____________ Start time: ____________ Doreatha Martin time: ____________ Date: ____________ Movements: ____________ Start time: ____________ Doreatha Martin time: ____________ Date: ____________ Movements: ____________ Start time: ____________ Doreatha Martin time: ____________ Date: ____________ Movements: ____________ Start time: ____________ Doreatha Martin time: ____________ Date: ____________ Movements: ____________ Start time: ____________ Doreatha Martin time: ____________ Date: ____________ Movements: ____________ Start time: ____________ Doreatha Martin time: ____________  Date: ____________ Movements: ____________ Start time: ____________ Doreatha Martin time: ____________ Date: ____________ Movements: ____________ Start time: ____________ Doreatha Martin time: ____________ Date: ____________ Movements: ____________ Start time: ____________ Doreatha Martin time: ____________ Date: ____________ Movements: ____________ Start time: ____________ Doreatha Martin time: ____________ Date: ____________ Movements: ____________ Start time: ____________ Doreatha Martin time: ____________ Date: ____________ Movements: ____________ Start time: ____________ Doreatha Martin time: ____________ Date: ____________ Movements: ____________ Start time: ____________ Doreatha Martin time: ____________  Date: ____________ Movements: ____________ Start time: ____________ Doreatha Martin time: ____________ Date: ____________  Movements: ____________ Start time: ____________ Doreatha Martin time: ____________ Date: ____________ Movements: ____________ Start time: ____________ Doreatha Martin time: ____________ Date: ____________ Movements: ____________ Start time: ____________ Doreatha Martin time: ____________ Date: ____________ Movements: ____________ Start time: ____________ Doreatha Martin time: ____________ Date: ____________ Movements: ____________ Start time: ____________ Doreatha Martin time: ____________ Date: ____________ Movements: ____________ Start time: ____________ Doreatha Martin time: ____________  Date: ____________ Movements: ____________ Start time: ____________ Doreatha Martin time: ____________ Date: ____________ Movements: ____________ Start time: ____________ Doreatha Martin time: ____________ Date: ____________ Movements: ____________ Start time: ____________ Doreatha Martin time: ____________ Date: ____________ Movements: ____________ Start time: ____________ Doreatha Martin time: ____________ Date: ____________ Movements: ____________ Start time: ____________ Doreatha Martin time: ____________ Date: ____________ Movements: ____________ Start time: ____________ Doreatha Martin time: ____________ Date: ____________ Movements: ____________ Start time: ____________ Doreatha Martin time: ____________  Date: ____________ Movements: ____________ Start time: ____________ Doreatha Martin time: ____________ Date: ____________ Movements: ____________ Start time: ____________ Doreatha Martin time: ____________ Date: ____________ Movements: ____________ Start time: ____________ Doreatha Martin time: ____________ Date: ____________ Movements: ____________ Start time: ____________ Doreatha Martin time: ____________ Date: ____________ Movements: ____________ Start time: ____________ Doreatha Martin time: ____________ Date: ____________ Movements: ____________ Start time: ____________ Doreatha Martin time: ____________ Document Released: 08/31/2006 Document Revised: 07/18/2012 Document Reviewed: 05/28/2012 [employer] Patient Information 2014 [employer].

## 2013-09-06 LAB — URINE CULTURE

## 2013-09-08 ENCOUNTER — Encounter (HOSPITAL_COMMUNITY): Payer: Self-pay | Admitting: *Deleted

## 2013-09-08 ENCOUNTER — Encounter (HOSPITAL_COMMUNITY): Payer: PRIVATE HEALTH INSURANCE | Admitting: Anesthesiology

## 2013-09-08 ENCOUNTER — Inpatient Hospital Stay (HOSPITAL_COMMUNITY)
Admission: AD | Admit: 2013-09-08 | Discharge: 2013-09-10 | DRG: 774 | Disposition: A | Discharge status: Home / Self Care | Payer: PRIVATE HEALTH INSURANCE | Source: Ambulatory Visit | Attending: Obstetrics | Admitting: Obstetrics

## 2013-09-08 ENCOUNTER — Inpatient Hospital Stay (HOSPITAL_COMMUNITY): Payer: PRIVATE HEALTH INSURANCE | Admitting: Anesthesiology

## 2013-09-08 DIAGNOSIS — O9903 Anemia complicating the puerperium: Secondary | ICD-10-CM | POA: Diagnosis not present

## 2013-09-08 DIAGNOSIS — E079 Disorder of thyroid, unspecified: Secondary | ICD-10-CM | POA: Diagnosis present

## 2013-09-08 DIAGNOSIS — D62 Acute posthemorrhagic anemia: Secondary | ICD-10-CM | POA: Diagnosis not present

## 2013-09-08 DIAGNOSIS — O99284 Endocrine, nutritional and metabolic diseases complicating childbirth: Secondary | ICD-10-CM

## 2013-09-08 DIAGNOSIS — O878 Other venous complications in the puerperium: Secondary | ICD-10-CM | POA: Diagnosis present

## 2013-09-08 DIAGNOSIS — K649 Unspecified hemorrhoids: Secondary | ICD-10-CM | POA: Diagnosis present

## 2013-09-08 DIAGNOSIS — A6000 Herpesviral infection of urogenital system, unspecified: Secondary | ICD-10-CM | POA: Diagnosis present

## 2013-09-08 DIAGNOSIS — O98519 Other viral diseases complicating pregnancy, unspecified trimester: Secondary | ICD-10-CM | POA: Diagnosis present

## 2013-09-08 DIAGNOSIS — E039 Hypothyroidism, unspecified: Secondary | ICD-10-CM | POA: Diagnosis present

## 2013-09-08 HISTORY — DX: Anemia, unspecified: D64.9

## 2013-09-08 HISTORY — DX: Hypothyroidism, unspecified: E03.9

## 2013-09-08 LAB — CBC
HCT: 32.7 % — ABNORMAL LOW (ref 36.0–46.0)
Hemoglobin: 11.1 g/dL — ABNORMAL LOW (ref 12.0–15.0)
MCH: 28.8 pg (ref 26.0–34.0)
MCHC: 33.9 g/dL (ref 30.0–36.0)
MCV: 84.9 fL (ref 78.0–100.0)
PLATELETS: 232 10*3/uL (ref 150–400)
RBC: 3.85 MIL/uL — ABNORMAL LOW (ref 3.87–5.11)
RDW: 13.5 % (ref 11.5–15.5)
WBC: 11.3 10*3/uL — AB (ref 4.0–10.5)

## 2013-09-08 LAB — TYPE AND SCREEN
ABO/RH(D): AB POS
Antibody Screen: NEGATIVE

## 2013-09-08 LAB — ABO/RH: ABO/RH(D): AB POS

## 2013-09-08 MED ORDER — OXYTOCIN BOLUS FROM INFUSION
500.0000 mL | INTRAVENOUS | Status: DC
  Administered 2013-09-08: 500 mL via INTRAVENOUS

## 2013-09-08 MED ORDER — FENTANYL 2.5 MCG/ML BUPIVACAINE 1/10 % EPIDURAL INFUSION (WH - ANES)
INTRAMUSCULAR | Status: DC | PRN
  Administered 2013-09-08: 14 mL/h via EPIDURAL

## 2013-09-08 MED ORDER — PENICILLIN G POTASSIUM 5000000 UNITS IJ SOLR
2.5000 10*6.[IU] | INTRAMUSCULAR | Status: DC
  Administered 2013-09-08: 2.5 10*6.[IU] via INTRAVENOUS
  Filled 2013-09-08 (×5): qty 2.5

## 2013-09-08 MED ORDER — LACTATED RINGERS IV SOLN
500.0000 mL | INTRAVENOUS | Status: DC | PRN
  Administered 2013-09-08: 1000 mL via INTRAVENOUS

## 2013-09-08 MED ORDER — DIPHENHYDRAMINE HCL 50 MG/ML IJ SOLN: 12.5000 mg | INTRAMUSCULAR | Status: DC | PRN

## 2013-09-08 MED ORDER — FENTANYL 2.5 MCG/ML BUPIVACAINE 1/10 % EPIDURAL INFUSION (WH - ANES)
14.0000 mL/h | INTRAMUSCULAR | Status: DC | PRN
  Filled 2013-09-08: qty 125

## 2013-09-08 MED ORDER — LIDOCAINE HCL (PF) 1 % IJ SOLN
INTRAMUSCULAR | Status: DC | PRN
Start: 2013-09-08 — End: 2013-09-09
  Administered 2013-09-08 (×2): 9 mL

## 2013-09-08 MED ORDER — EPHEDRINE 5 MG/ML INJ
10.0000 mg | INTRAVENOUS | Status: DC | PRN
  Filled 2013-09-08: qty 2
  Filled 2013-09-08: qty 4

## 2013-09-08 MED ORDER — LIDOCAINE HCL (PF) 1 % IJ SOLN
30.0000 mL | INTRAMUSCULAR | Status: DC | PRN
  Filled 2013-09-08 (×2): qty 30

## 2013-09-08 MED ORDER — PENICILLIN G POTASSIUM 5000000 UNITS IJ SOLR
5.0000 10*6.[IU] | Freq: Once | INTRAVENOUS | Status: AC
  Administered 2013-09-08: 5 10*6.[IU] via INTRAVENOUS
  Filled 2013-09-08: qty 5

## 2013-09-08 MED ORDER — LACTATED RINGERS IV SOLN: 500.0000 mL | Freq: Once | INTRAVENOUS | Status: DC

## 2013-09-08 MED ORDER — ACETAMINOPHEN 325 MG PO TABS: 650.0000 mg | ORAL_TABLET | ORAL | Status: DC | PRN

## 2013-09-08 MED ORDER — OXYTOCIN 40 UNITS IN LACTATED RINGERS INFUSION - SIMPLE MED
62.5000 mL/h | INTRAVENOUS | Status: DC
  Filled 2013-09-08: qty 1000

## 2013-09-08 MED ORDER — CITRIC ACID-SODIUM CITRATE 334-500 MG/5ML PO SOLN
30.0000 mL | ORAL | Status: DC | PRN
  Administered 2013-09-08: 30 mL via ORAL
  Filled 2013-09-08: qty 15

## 2013-09-08 MED ORDER — IBUPROFEN 600 MG PO TABS: 600.0000 mg | ORAL_TABLET | Freq: Four times a day (QID) | ORAL | Status: DC | PRN

## 2013-09-08 MED ORDER — OXYCODONE-ACETAMINOPHEN 5-325 MG PO TABS
1.0000 | ORAL_TABLET | ORAL | Status: DC | PRN
  Filled 2013-09-08: qty 1

## 2013-09-08 MED ORDER — EPHEDRINE 5 MG/ML INJ
10.0000 mg | INTRAVENOUS | Status: DC | PRN
  Administered 2013-09-08: 10 mg via INTRAVENOUS
  Filled 2013-09-08: qty 2

## 2013-09-08 MED ORDER — LACTATED RINGERS IV SOLN
INTRAVENOUS | Status: DC
  Administered 2013-09-08: 20:00:00 via INTRAVENOUS

## 2013-09-08 MED ORDER — PHENYLEPHRINE 40 MCG/ML (10ML) SYRINGE FOR IV PUSH (FOR BLOOD PRESSURE SUPPORT)
80.0000 ug | PREFILLED_SYRINGE | INTRAVENOUS | Status: DC | PRN
  Filled 2013-09-08: qty 2
  Filled 2013-09-08: qty 10

## 2013-09-08 MED ORDER — PHENYLEPHRINE 40 MCG/ML (10ML) SYRINGE FOR IV PUSH (FOR BLOOD PRESSURE SUPPORT)
80.0000 ug | PREFILLED_SYRINGE | INTRAVENOUS | Status: DC | PRN
  Filled 2013-09-08: qty 2

## 2013-09-08 MED ORDER — ONDANSETRON HCL 4 MG/2ML IJ SOLN: 4.0000 mg | Freq: Four times a day (QID) | INTRAMUSCULAR | Status: DC | PRN

## 2013-09-08 NOTE — Progress Notes (Signed)
S: Doing well, no complaints, pain well controlled with epidural  O: BP 88/50  Pulse 90  Temp(Src) 98.4 F (36.9 C) (Oral)  Resp 18  Ht 5\' 6"  (1.676 m)  Wt 84.823 kg (187 lb)  BMI 30.20 kg/m2   FHT:  FHR: 125's bpm, variability: moderate,  accelerations:  Present,  decelerations:  Absent UC:   regular, every 4 minutes SVE:   Dilation: 10 Effacement (%): 100 Station: +2 Exam by:: Dr. Ernestina PennaFogleman   A / P:  22 y.o.  Obstetric History   G1   P0   T0   P0   A0   TAB0   SAB0   E0   M0   L0    at 1654w1d Spontaneous labor, progressing normally  Fetal Wellbeing:  Category I Pain Control:  Epidural  Anticipated MOD:  NSVD  Allow passive descent for the next 30 min then start pushing  [name] A. 09/08/2013, 11:12 PM

## 2013-09-08 NOTE — Anesthesia Procedure Notes (Signed)
Epidural Patient location during procedure: OB Start time: 09/08/2013 7:18 PM End time: 09/08/2013 7:22 PM  Staffing Anesthesiologist: Leilani AbleHATCHETT, Allora Bains Performed by: anesthesiologist   Preanesthetic Checklist Completed: patient identified, surgical consent, pre-op evaluation, timeout performed, IV checked, risks and benefits discussed and monitors and equipment checked  Epidural Patient position: sitting Prep: site prepped and draped and DuraPrep Patient monitoring: continuous pulse ox and blood pressure Approach: midline Injection technique: LOR air  Needle:  Needle type: Tuohy  Needle gauge: 17 G Needle length: 9 cm and 9 Needle insertion depth: 5 cm cm Catheter type: closed end flexible Catheter size: 19 Gauge Catheter at skin depth: 10 cm Test dose: negative and Other  Assessment Sensory level: T9 Events: blood not aspirated, injection not painful, no injection resistance, negative IV test and no paresthesia  Additional Notes Reason for block:procedure for pain

## 2013-09-08 NOTE — MAU Note (Signed)
Pt presents with complaints of contractions that started yesterday but have gotten regular today. Denies any bleeding or LOF.

## 2013-09-08 NOTE — Anesthesia Preprocedure Evaluation (Addendum)
Anesthesia Evaluation  Patient identified by MRN, date of birth, ID band Patient awake    Reviewed: Allergy & Precautions, H&P , NPO status , Patient's Chart, lab work & pertinent test results  Airway Mallampati: II TM Distance: >3 FB Neck ROM: full    Dental   Pulmonary  breath sounds clear to auscultation        Cardiovascular Rhythm:regular Rate:Normal     Neuro/Psych    GI/Hepatic   Endo/Other  Hypothyroidism   Renal/GU      Musculoskeletal   Abdominal   Peds  Hematology  (+) anemia ,   Anesthesia Other Findings   Reproductive/Obstetrics (+) Pregnancy                         Anesthesia Physical Anesthesia Plan  ASA: II  Anesthesia Plan: Epidural   Post-op Pain Management:    Induction:   Airway Management Planned:   Additional Equipment:   Intra-op Plan:   Post-operative Plan:   Informed Consent: I have reviewed the patients History and Physical, chart, labs and discussed the procedure including the risks, benefits and alternatives for the proposed anesthesia with the patient or authorized representative who has indicated his/her understanding and acceptance.     Plan Discussed with:   Anesthesia Plan Comments:         Anesthesia Quick Evaluation

## 2013-09-08 NOTE — H&P (Signed)
Julia ColtJennifer Friedman is a 23 y.o. G1P0 at 6228w1d presenting for labor/ contractions. Pt notes onset contractions earlier today . Good fetal movement, No vaginal bleeding, not leaking fluid.  [facility] 8 w[facility] - dated by 6 wk us\/s - GBS bacteruria - h/o depression/ anxiety -HSV, on Valtrex suppression - raised as a Jehovas witness and has stated earlier in pregnancy that she declines blood products.    Prenatal Transfer Tool  Maternal Diabetes: No Genetic Screening: Normal Maternal Ultrasounds/Referrals: Normal Fetal Ultrasounds or other Referrals:  None Maternal Substance Abuse:  No Significant Maternal Medications:  None Significant Maternal Lab Results: None     OB History   Grav Para Term Preterm Abortions TAB SAB Ect Mult Living   1         0     Past Medical History  Diagnosis Date  . Herpes    Past Surgical History  Procedure Laterality Date  . Tonsillectomy    . Wisdom tooth extraction     Family History: family history is not on file. Social History:  reports that she has never smoked. She does not have any smokeless tobacco history on file. She reports that she drinks alcohol. She reports that she does not use illicit drugs.  Review of Systems - Negative except painful contractions   Dilation: 4 Effacement (%): 90 Station: 0 Exam by:: [name] Blood pressure 120/73, pulse 94, temperature 98.2 F (36.8 C), temperature source Oral, resp. rate 20.  Physical Exam: deferred    Prenatal labs: ABO, Rh:  ABpos Antibody:  neg Rubella:  immune RPR:   NR HBsAg:   neg HIV:   neg GBS:   positive 1 hr Glucola 116  Genetic screening nl NT, nl AFP Anatomy US nl   Assessment/Plan: 23 y.o. G1P0 at 6028w1d Active labor. Admit for routine labor Desired epidural GBS pos. Start PCN [name] witness, will need to sign blood declination  H/o depression/ anxiety, monitor PP   [name] A. 09/08/2013, 6:24 PM

## 2013-09-08 NOTE — Progress Notes (Signed)
S: Doing well, no complaints, pain well controlled with epidural. Pt reports last HSV outbreak 1 yr ago, has been taking Valtrex.   O: BP 92/60  Pulse 96  Temp(Src) 98.4 F (36.9 C) (Oral)  Resp 18  Ht 5\' 6"  (1.676 m)  Wt 84.823 kg (187 lb)  BMI 30.20 kg/m2   FHT:  FHR: 130 bpm, variability: moderate,  accelerations:  Present,  decelerations:  Absent UC:   regular, every 4 minutes SVE:   Dilation: 5 Effacement (%): 90 Station: -2 Exam by:: Dr. Ernestina PennaFogleman AROM clear fluid, bloody show noted CV: RRR Pulm : CTAB Abd: gravid, EFW 8# LE: trace edema, NT GU: single ingrown hair in the mons w/ pinpoint amount of small purulent material contained in a vesicle, no evidence HSV.   A / P:  23 y.o.  Obstetric History   G1   P0   T0   P0   A0   TAB0   SAB0   E0   M0   L0    at 7952w1d Augmentation of labor, progressing well H/o HSV. No active lesions, continue attempt at SVD.  - h/o jehovas witness, pt accepts blood if no other alternatives, this comment witnessed by sister, boyfriend and nurse.   Fetal Wellbeing:  Category I Pain Control:  Epidural  Anticipated MOD:  NSVD  Alizah Sills A. 09/08/2013, 9:15 PM

## 2013-09-09 ENCOUNTER — Encounter (HOSPITAL_COMMUNITY): Payer: Self-pay | Admitting: *Deleted

## 2013-09-09 LAB — RPR: RPR Ser Ql: NONREACTIVE

## 2013-09-09 LAB — CBC
HCT: 26.2 % — ABNORMAL LOW (ref 36.0–46.0)
Hemoglobin: 8.9 g/dL — ABNORMAL LOW (ref 12.0–15.0)
MCH: 28.8 pg (ref 26.0–34.0)
MCHC: 34 g/dL (ref 30.0–36.0)
MCV: 84.8 fL (ref 78.0–100.0)
Platelets: 231 10*3/uL (ref 150–400)
RBC: 3.09 MIL/uL — ABNORMAL LOW (ref 3.87–5.11)
RDW: 13.5 % (ref 11.5–15.5)
WBC: 16.2 10*3/uL — AB (ref 4.0–10.5)

## 2013-09-09 MED ORDER — LANOLIN HYDROUS EX OINT: TOPICAL_OINTMENT | CUTANEOUS | Status: DC | PRN

## 2013-09-09 MED ORDER — IBUPROFEN 600 MG PO TABS
600.0000 mg | ORAL_TABLET | Freq: Four times a day (QID) | ORAL | Status: DC
  Administered 2013-09-09 – 2013-09-10 (×6): 600 mg via ORAL
  Filled 2013-09-09 (×6): qty 1

## 2013-09-09 MED ORDER — ONDANSETRON HCL 4 MG/2ML IJ SOLN: 4.0000 mg | INTRAMUSCULAR | Status: DC | PRN

## 2013-09-09 MED ORDER — BISACODYL 10 MG RE SUPP: 10.0000 mg | Freq: Every day | RECTAL | Status: DC | PRN

## 2013-09-09 MED ORDER — FLEET ENEMA 7-19 GM/118ML RE ENEM: 1.0000 | ENEMA | Freq: Every day | RECTAL | Status: DC | PRN

## 2013-09-09 MED ORDER — OXYCODONE-ACETAMINOPHEN 5-325 MG PO TABS
1.0000 | ORAL_TABLET | ORAL | Status: DC | PRN
  Administered 2013-09-09: 1 via ORAL
  Administered 2013-09-09 – 2013-09-10 (×5): 2 via ORAL
  Filled 2013-09-09 (×5): qty 2

## 2013-09-09 MED ORDER — DIPHENHYDRAMINE HCL 25 MG PO CAPS: 25.0000 mg | ORAL_CAPSULE | Freq: Four times a day (QID) | ORAL | Status: DC | PRN

## 2013-09-09 MED ORDER — WITCH HAZEL-GLYCERIN EX PADS: 1.0000 "application " | MEDICATED_PAD | CUTANEOUS | Status: DC | PRN

## 2013-09-09 MED ORDER — ZOLPIDEM TARTRATE 5 MG PO TABS: 5.0000 mg | ORAL_TABLET | Freq: Every evening | ORAL | Status: DC | PRN

## 2013-09-09 MED ORDER — BENZOCAINE-MENTHOL 20-0.5 % EX AERO
1.0000 "application " | INHALATION_SPRAY | CUTANEOUS | Status: DC | PRN
  Administered 2013-09-09: 1 via TOPICAL
  Filled 2013-09-09 (×2): qty 56

## 2013-09-09 MED ORDER — PRENATAL MULTIVITAMIN CH
1.0000 | ORAL_TABLET | Freq: Every day | ORAL | Status: DC
  Administered 2013-09-09 – 2013-09-10 (×2): 1 via ORAL
  Filled 2013-09-09 (×2): qty 1

## 2013-09-09 MED ORDER — SENNOSIDES-DOCUSATE SODIUM 8.6-50 MG PO TABS
2.0000 | ORAL_TABLET | ORAL | Status: DC
  Administered 2013-09-09: 2 via ORAL
  Filled 2013-09-09: qty 2

## 2013-09-09 MED ORDER — TETANUS-DIPHTH-ACELL PERTUSSIS 5-2.5-18.5 LF-MCG/0.5 IM SUSP: 0.5000 mL | Freq: Once | INTRAMUSCULAR | Status: DC

## 2013-09-09 MED ORDER — ONDANSETRON HCL 4 MG PO TABS: 4.0000 mg | ORAL_TABLET | ORAL | Status: DC | PRN

## 2013-09-09 MED ORDER — SIMETHICONE 80 MG PO CHEW: 80.0000 mg | CHEWABLE_TABLET | ORAL | Status: DC | PRN

## 2013-09-09 MED ORDER — DIBUCAINE 1 % RE OINT
1.0000 "application " | TOPICAL_OINTMENT | RECTAL | Status: DC | PRN
  Filled 2013-09-09 (×2): qty 28

## 2013-09-09 MED ORDER — POLYSACCHARIDE IRON COMPLEX 150 MG PO CAPS
150.0000 mg | ORAL_CAPSULE | Freq: Two times a day (BID) | ORAL | Status: DC
  Administered 2013-09-09 – 2013-09-10 (×3): 150 mg via ORAL
  Filled 2013-09-09 (×3): qty 1

## 2013-09-09 NOTE — Anesthesia Postprocedure Evaluation (Signed)
Anesthesia Post Note  Patient: [name]  Procedure(s) Performed: * No procedures listed *  Anesthesia type: Epidural  Patient location: Mother/Baby  Post pain: Pain level controlled  Post assessment: Post-op Vital signs reviewed  Last Vitals:  Filed Vitals:   09/09/13 0324  BP: 104/51  Pulse: 88  Temp: 36.6 C  Resp: 18    Post vital signs: Reviewed  Level of consciousness: awake  Complications: No apparent anesthesia complications

## 2013-09-09 NOTE — Progress Notes (Addendum)
Patient ID: Signe ColtJennifer Nazari, female   DOB: 1991-06-11, 23 y.o.   MRN: [id number] PPD # 1 SVD  S:  Reports feeling well, but sore             Tolerating po/ No nausea or vomiting  Breastfeeding using nipple shields and hand pumping breast milk             Bleeding is light             Pain controlled with ibuprofen (OTC) and Percocet             Up ad lib / ambulatory / voiding without difficulties    Newborn  Information for the patient's newborn:  [name], Boy Neddie [[id number]][[id number]]  female  breast feeding  / Circumcision planning   O:  A & O x 3, in no apparent distress              VS:  Filed Vitals:   09/09/13 0046 09/09/13 0059 09/09/13 0223 09/09/13 0324  BP: 100/62 98/56 103/57 104/51  Pulse: 95 86 93 88  Temp:   97.7 F (36.5 C) 97.8 F (36.6 C)  TempSrc:   Oral Oral  Resp: 18  18 18   Height:      Weight:        LABS:  Recent Labs  09/08/13 1815 09/09/13 0605  WBC 11.3* 16.2*  HGB 11.1* 8.9*  HCT 32.7* 26.2*  PLT 232 231    Blood type: --/--/AB POS, AB POS (01/25 1905)  Rubella: Immune (06/25 0000)   I&O: I/O last 3 completed shifts: In: -  Out: 450 [Blood:450]             Lungs: Clear and unlabored  Heart: regular rate and rhythm / no murmurs  Abdomen: soft, non-tender, non-distended             Fundus: firm, non-tender, U-1  Perineum: labial repair healing well - very edematous, ice pack in place  Lochia: small  Extremities: no edema, no calf pain or tenderness, no Homans    A/P: PPD # 1  23 y.o., G1P1001   Postpartum care following vaginal delivery (1/25)  ABL Anemia   Doing well - stable status  Start Niferex 150 mg BID  Routine post partum orders  Anticipate discharge tomorrow    Raelyn MoraAWSON, Aidee Latimore, M, MSN, CNM 09/09/2013, 9:10 AM

## 2013-09-10 MED ORDER — HYDROCORTISONE ACE-PRAMOXINE 1-1 % RE FOAM: 1.0000 | Freq: Two times a day (BID) | RECTAL | Status: DC

## 2013-09-10 MED ORDER — OXYCODONE-ACETAMINOPHEN 5-325 MG PO TABS: 1.0000 | ORAL_TABLET | Freq: Four times a day (QID) | ORAL | Status: DC | PRN

## 2013-09-10 MED ORDER — SENNOSIDES-DOCUSATE SODIUM 8.6-50 MG PO TABS: 2.0000 | ORAL_TABLET | Freq: Every evening | ORAL | Status: DC | PRN

## 2013-09-10 MED ORDER — IBUPROFEN 600 MG PO TABS: 600.0000 mg | ORAL_TABLET | Freq: Four times a day (QID) | ORAL | Status: DC | PRN

## 2013-09-10 MED ORDER — POLYSACCHARIDE IRON COMPLEX 150 MG PO CAPS: 150.0000 mg | ORAL_CAPSULE | Freq: Two times a day (BID) | ORAL | Status: DC

## 2013-09-10 NOTE — Progress Notes (Signed)
Patient ID: [name], female   DOB: 1990/11/23, 23 y.o.   MRN: [id number] PPD # 2    Subjective: Pt reports feeling well/ Pain controlled with ibuprofen and percocet Tolerating po/ Voiding without problems/ No n/v Bleeding is light/ Newborn info:  Information for the patient's newborn:  [name], [name] [[id number]][[id number]]  female  / circ completed/ Feeding: breast    Objective:  VS: Blood pressure 95/56, pulse 101, temperature 97.8 F (36.6 C), temperature source Oral, resp. rate 16    Recent Labs  09/08/13 1815 09/09/13 0605  WBC 11.3* 16.2*  HGB 11.1* 8.9*  HCT 32.7* 26.2*  PLT 232 231    Blood type: AB POS Rubella: Immune    Physical Exam:  General:  alert, cooperative and no distress CV: Regular rate and rhythm Resp: clear Abdomen: soft, nontender, normal bowel sounds Uterine Fundus: firm, below umbilicus, nontender Perineum: not inspected Lochia: minimal Ext: Homans sign is negative, no sign of DVT and no edema, redness or tenderness in the calves or thighs    A/P: PPD # 2/ G1P1001/ S/P: SVD ABL Anemia Hemorrhoids Doing well and stable for discharge home RX: Ibuprofen 600mg  po Q 6 hrs prn pain #30 Refill x 1 Niferex 150mg  po QD/BID #30/#60 Refill x 1 Percocet 5/325 1 to 2 po Q 4 hrs prn pain #15 No refill Colace 100mg  po up to TID prn #30 Ref x 1 Proctofoam TID WOB/GYN booklet given Routine pp visit in 6wks   Demetrius RevelFISHER,Json Koelzer K, MSN, [facility]NP 09/10/2013, 9:13 AM

## 2013-09-10 NOTE — Discharge Summary (Signed)
Obstetric Discharge Summary Reason for Admission: G1  P0 @ 39w 1d with spontaneous onset of contractions; GBS Pos, PCN per protocol planned; Jehovah's Witness, to sign blood declination form     Hx depression; monitor pp Prenatal Procedures: NST and ultrasound Intrapartum Procedures: spontaneous vaginal delivery Postpartum Procedures: none Complications-Operative and Postpartum: 2nd degree perineal laceration Hemoglobin  Date Value Range Status  09/09/2013 8.9* 12.0 - 15.0 g/dL Final     DELTA CHECK NOTED     REPEATED TO VERIFY     HCT  Date Value Range Status  09/09/2013 26.2* 36.0 - 46.0 % Final    Physical Exam:  General: alert, cooperative and no distress Lochia: appropriate Uterine Fundus: firm Incision: n/a DVT Evaluation: No evidence of DVT seen on physical exam.  Discharge Diagnoses: G1 P1 s/p SVD w/ 2nd deg laceration.  ABL Anemia.  Hx depression; monitor pp  Discharge Information: Date: 09/10/2013 Activity: pelvic rest Diet: routine Medications: PNV, Ibuprofen, Colace, Iron, Percocet and Proctofoam Condition: stable Instructions: refer to practice specific booklet Discharge to: home Follow-up Information   Follow up with MODY,VAISHALI R, MD In 6 weeks.   Specialty:  Obstetrics and Gynecology   Contact information:   [address] Gash[address] [place] [id number] [phone]       Newborn Data: Live born female on 09[birth year]/13 Birth Weight: 6 lb 8.1 oz (2950 g) APGAR: 9, 9  Home with mother.  Jenaya Saar K 09/10/2013, 9:14 AM

## 2013-09-10 NOTE — Discharge Summary (Signed)
Reviewed and agree with note and plan. V.Ralyn Stlaurent, MD  

## 2013-09-13 ENCOUNTER — Ambulatory Visit (HOSPITAL_COMMUNITY)
Admit: 2013-09-13 | Discharge: 2013-09-13 | Disposition: A | Discharge status: Home / Self Care | Payer: PRIVATE HEALTH INSURANCE | Attending: Obstetrics & Gynecology | Admitting: Obstetrics & Gynecology

## 2013-09-13 NOTE — Lactation Note (Addendum)
Infant Lactation Consultation Outpatient Visit Note  Mother's Name: [name]                                                      Infant name:  [name] Date of Birth: 02/13/1991                                                                  DOB:  [birth year] Type of Delivery:  vaginal                                                                Birth weight:  6 lbs 8.1 oz          Discharge weight: 6lbs 3.8 oz (4% weight loss)   Gestational Age at Delivery:  39w 1d                                              Pediatrician appt 09/12/13 - 6 lbs. 0 oz.                                                                                                        Today's weight: 6 lbs. 2.8 oz (increase of 2.8 oz in 1 day)                                                                                                            Breastfeeding History Frequency of Breastfeeding: every 3 hrs Length of Feeding: 20-25 min Voids: 8/24 hrs Stools: 8/24 hrs yellow, seedy  Supplementing / Method: Pumping:  Type of Pump: Medela Freestyle   Frequency: after breast feeding for 30 mins, the breast not fed on  Volume:  Up to 2 oz  Comments: Supplementing [name] with 2 curved tip syringe-full of breast milk (approx 30 ml), under nipple shield at every feeding.  Grissel also complaining of pinching and soreness  of her nipples.  Using Comfort Gels, and encouraged her to use her EBM onto nipples prior to placing Comfort Gels on. It was noted that baby has a noticeable frenulum that attaches close to tip of tongue, no heart shaped tongue noted, but tongue doesn't protrude out of mouth past his lower lip.  During breast feeding, with assistance to attain a deep latch onto nipple shield, unable to visualize baby's tongue cupping under nipple.  Talked with [name] about my concern that this could be contributing to her soreness, and [name]'s latch difficulty causing less transferring milk from her breast.   Encour[facility] [name]. Also, noted that [name] was exhausted.  She was anemic prior to delivering, and last hgb was 8.9.  Concerned about [name] not getting enough rest shared.  Consultation Evaluation:  Initial Feeding Assessment: Pre-feed Weight: 2802 gm Post-feed Weight: 2812 gm Amount Transferred: 10 ml Comments: Right breast using 24 mm nipple shield.  [name] needed guidance about getting depth on the breast.  Encouraged breast sandwiching and alternating breast compression during feeding, breasts full/not engorged.  Additional Feeding Assessment: Pre-feed Weight:  2812 gm Post-feed Weight: 2814 gm Amount Transferred: 2 ml Comments:  Total of 30 mins on the breast.  Additional Feeding Assessment: Pre-feed Weight:  2814 gm Post-feed Weight: 2836 gm Amount Transferred:  22 ml Comments:  Initiated SNS under nipple shield, using 45 ml of EBM.  Baby was more aggressive at the breast and pulled the milk slowly.  Baby took 20 ml from SNS before he came off breast himself.  Slow flow Medela nipple used to bottle feed [name] the remainder 25 ml of EBM.  Instructed FOB how to support baby's head, and sit him upright.  Nipple placed fully in [name]'s mouth to maintain that depth on the nipple, like the breast.  Total Breast milk Transferred this Visit: 14 ml directly from breast  Total Supplement Given:  45 ml breast milk by SNS/bottle  Plan-  1- feed [name] using nipple shield (pre pump prior to pull nipple out) and use SNS with 45 ml EBM.  Watch for a wide gape of mouth and latched deeply onto breast.  Use firm breast support, and alternating breast compression during feeding 2- pump both breasts 15-20 min after feeding 3- If [name] exhausted, pump both breasts 20 mins, and go to bed.  FOB or GMOB to bottle feed [name] using a slow flow bottle as instructed, with about 60 ml EBM 4- To ask Pediatrician about [name]'s frenulum as latching is  painful, and baby has difficulty transferring milk from breast.     Follow-Up  Friday, February 6th @ 10:30am  To call prn    [name], [name] E 09/13/2013, 9:08 AM

## 2013-09-14 ENCOUNTER — Encounter (HOSPITAL_COMMUNITY): Payer: Self-pay | Admitting: *Deleted

## 2013-09-14 ENCOUNTER — Inpatient Hospital Stay (HOSPITAL_COMMUNITY)
Admission: AD | Admit: 2013-09-14 | Discharge: 2013-09-14 | Disposition: A | Discharge status: Home / Self Care | Payer: PRIVATE HEALTH INSURANCE | Source: Ambulatory Visit | Attending: Obstetrics & Gynecology | Admitting: Obstetrics & Gynecology

## 2013-09-14 DIAGNOSIS — O9081 Anemia of the puerperium: Secondary | ICD-10-CM | POA: Insufficient documentation

## 2013-09-14 DIAGNOSIS — M7989 Other specified soft tissue disorders: Secondary | ICD-10-CM | POA: Insufficient documentation

## 2013-09-14 DIAGNOSIS — E079 Disorder of thyroid, unspecified: Secondary | ICD-10-CM | POA: Insufficient documentation

## 2013-09-14 DIAGNOSIS — A6000 Herpesviral infection of urogenital system, unspecified: Secondary | ICD-10-CM | POA: Insufficient documentation

## 2013-09-14 DIAGNOSIS — D649 Anemia, unspecified: Secondary | ICD-10-CM | POA: Insufficient documentation

## 2013-09-14 DIAGNOSIS — E039 Hypothyroidism, unspecified: Secondary | ICD-10-CM | POA: Insufficient documentation

## 2013-09-14 DIAGNOSIS — O99285 Endocrine, nutritional and metabolic diseases complicating the puerperium: Principal | ICD-10-CM

## 2013-09-14 DIAGNOSIS — O9853 Other viral diseases complicating the puerperium: Secondary | ICD-10-CM | POA: Insufficient documentation

## 2013-09-14 LAB — URINALYSIS, ROUTINE W REFLEX MICROSCOPIC
Bilirubin Urine: NEGATIVE
Glucose, UA: NEGATIVE mg/dL
Ketones, ur: NEGATIVE mg/dL
Leukocytes, UA: NEGATIVE
Nitrite: NEGATIVE
PROTEIN: NEGATIVE mg/dL
SPECIFIC GRAVITY, URINE: 1.025 (ref 1.005–1.030)
Urobilinogen, UA: 0.2 mg/dL (ref 0.0–1.0)
pH: 6 (ref 5.0–8.0)

## 2013-09-14 LAB — URINE MICROSCOPIC-ADD ON

## 2013-09-14 NOTE — MAU Note (Signed)
Rolitta Dawson CNM in to see pt 

## 2013-09-14 NOTE — Discharge Instructions (Signed)
Your exam appears normal this morning.  Closely monitor for increasing swelling in your arm.  Drink lots of water (at least 64 oz) daily.  Increase your rest.  Eat protein rich foods - to help healing and reduction of swelling.  Take ibuprofen around the clock and Percocet as you need it for pain. Edema Edema is an abnormal build-up of fluids in tissues. Because this is partly dependent on gravity (water flows to the lowest place), it is more common in the legs and thighs (lower extremities). It is also common in the looser tissues, like around the eyes. Painless swelling of the feet and ankles is common and increases as a person ages. It may affect both legs and may include the calves or even thighs. When squeezed, the fluid may move out of the affected area and may leave a dent for a few moments. CAUSES   Prolonged standing or sitting in one place for extended periods of time. Movement helps pump tissue fluid into the veins, and absence of movement prevents this, resulting in edema.  Varicose veins. The valves in the veins do not work as well as they should. This causes fluid to leak into the tissues.  Fluid and salt overload.  Injury, burn, or surgery to the leg, ankle, or foot, may damage veins and allow fluid to leak out.  Sunburn damages vessels. Leaky vessels allow fluid to go out into the sunburned tissues.  Allergies (from insect bites or stings, medications or chemicals) cause swelling by allowing vessels to become leaky.  Protein in the blood helps keep fluid in your vessels. Low protein, as in malnutrition, allows fluid to leak out.  Hormonal changes, including pregnancy and menstruation, cause fluid retention. This fluid may leak out of vessels and cause edema.  Medications that cause fluid retention. Examples are sex hormones, blood pressure medications, steroid treatment, or anti-depressants.  Some illnesses cause edema, especially heart failure, kidney disease, or liver  disease.  Surgery that cuts veins or lymph nodes, such as surgery done for the heart or for breast cancer, may result in edema. DIAGNOSIS  Your caregiver is usually easily able to determine what is causing your swelling (edema) by simply asking what is wrong (getting a history) and examining you (doing a physical). Sometimes x-rays, EKG (electrocardiogram or heart tracing), and blood work may be done to evaluate for underlying medical illness. TREATMENT  General treatment includes:  Leg elevation (or elevation of the affected body part).  Restriction of fluid intake.  Prevention of fluid overload.  Compression of the affected body part. Compression with elastic bandages or support stockings squeezes the tissues, preventing fluid from entering and forcing it back into the blood vessels.  Diuretics (also called water pills or fluid pills) pull fluid out of your body in the form of increased urination. These are effective in reducing the swelling, but can have side effects and must be used only under your caregiver's supervision. Diuretics are appropriate only for some types of edema. The specific treatment can be directed at any underlying causes discovered. Heart, liver, or kidney disease should be treated appropriately. HOME CARE INSTRUCTIONS   Elevate the legs (or affected body part) above the level of the heart, while lying down.  Avoid sitting or standing still for prolonged periods of time.  Avoid putting anything directly under the knees when lying down, and do not wear constricting clothing or garters on the upper legs.  Exercising the legs causes the fluid to work back into  the veins and lymphatic channels. This may help the swelling go down.  The pressure applied by elastic bandages or support stockings can help reduce ankle swelling.  A low-salt diet may help reduce fluid retention and decrease the ankle swelling.  Take any medications exactly as prescribed. SEEK MEDICAL  CARE IF:  Your edema is not responding to recommended treatments. SEEK IMMEDIATE MEDICAL CARE IF:   You develop shortness of breath or chest pain.  You cannot breathe when you lay down; or if, while lying down, you have to get up and go to the window to get your breath.  You are having increasing swelling without relief from treatment.  You develop a fever over 102 F (38.9 C).  You develop pain or redness in the areas that are swollen.  Tell your caregiver right away if you have gained 03 lb/1.4 kg in 1 day or 05 lb/2.3 kg in a week. MAKE SURE YOU:   Understand these instructions.  Will watch your condition.  Will get help right away if you are not doing well or get worse. Document Released: 08/01/2005 Document Revised: 01/31/2012 Document Reviewed: 03/19/2008 [employer] Patient Information 2014 [employer] [place].

## 2013-09-14 NOTE — Progress Notes (Signed)
Raelyn Moraolitta Dawson CNM notified of pt's admission and status. Aware of swelling in R arm below elbow and hand, v/s wnl, u/a results. Will see pt

## 2013-09-14 NOTE — Progress Notes (Signed)
Raelyn Moraolitta Dawson CNM in earlier and discussed d/c plan with pt. Written and verbal d/c instructions given and understanding voiced.

## 2013-09-14 NOTE — MAU Note (Addendum)
SVD 1/25 and no problems with delivery. R arm below elbow to tips of fingers swollen when awoke this am. Felt some chills last night and took temp twice and was normal. Has stitches which are sore and want them to be checked.

## 2013-09-14 NOTE — MAU Provider Note (Signed)
History     CSN: [id number]  Arrival date and time: 09/14/13 0450 Provider notified: 0602 Provider at bedside: 0610     Chief Complaint  Patient presents with  . Arm Swelling   HPI  [name] is a 23 yo G1P1001 female who is 6 days postpartum (SVD 09/08/13) presenting this morning  with complaints of swelling in her lower Rt arm and hand.  She reports that she was unable to make a fist before  leaving her house d/t the swelling.  She has a negative family or personal history of DVT.  Past Medical History  Diagnosis Date  . Herpes   . Hypothyroidism   . Anemia   . Postpartum care following vaginal delivery (1/25) 09/09/2013    Past Surgical History  Procedure Laterality Date  . Tonsillectomy    . Wisdom tooth extraction      Family History  Problem Relation Age of Onset  . Heart disease Maternal Grandmother   . Cancer Paternal Grandmother     History  Substance Use Topics  . Smoking status: Never Smoker   . Smokeless tobacco: Not on file  . Alcohol Use: No    Allergies: No Known Allergies  Prescriptions prior to admission  Medication Sig Dispense Refill  . hydrocortisone-pramoxine (PROCTOFOAM HC) rectal foam Place 1 applicator rectally 2 (two) times daily.  10 g  1  . ibuprofen (ADVIL,MOTRIN) 600 MG tablet Take 1 tablet (600 mg total) by mouth every 6 (six) hours as needed.  30 tablet  1  . iron polysaccharides (NIFEREX) 150 MG capsule Take 1 capsule (150 mg total) by mouth 2 (two) times daily.  60 capsule  1  . oxyCODONE-acetaminophen (PERCOCET/ROXICET) 5-325 MG per tablet Take 1-2 tablets by mouth every 6 (six) hours as needed for severe pain (moderate - severe pain).  12 tablet  0  . Prenatal Vit-Fe Fumarate-FA (PRENATAL MULTIVITAMIN) TABS tablet Take 1 tablet by mouth daily.      Marland Kitchen. senna-docusate (SENOKOT-S) 8.6-50 MG per tablet Take 2 tablets by mouth at bedtime as needed for mild constipation.  60 tablet  1  . valACYclovir (VALTREX) 500 MG tablet  Take 500 mg by mouth every evening.      . IRON PO Take 1 tablet by mouth daily.        Review of Systems  Constitutional: Negative.        Had some chills before going to bed last night, but afebrile  HENT: Negative.   Eyes: Negative.   Cardiovascular: Positive for leg swelling.       Since prior to delivery; swelling in Rt arm was worse when first woke up this morning, but better now  Gastrointestinal: Negative.   Genitourinary:       Pain area of episiotomy and vaginal repair  Skin: Negative.   Neurological: Negative.   Endo/Heme/Allergies: Negative.   Psychiatric/Behavioral: Negative.    Physical Exam   Blood pressure 97/65, pulse 79, temperature 98.4 F (36.9 C), resp. rate 18, height 5\' 6"  (1.676 m), weight 80.74 kg (178 lb), SpO2 99.00%.  Physical Exam  Constitutional: She is oriented to person, place, and time. She appears well-developed and well-nourished.  HENT:  Head: Normocephalic and atraumatic.  Eyes: Pupils are equal, round, and reactive to light.  Neck: Normal range of motion. Neck supple.  Cardiovascular: Normal rate, regular rhythm, normal heart sounds and intact distal pulses.   Respiratory: Effort normal and breath sounds normal.  GI: Soft. Bowel sounds are  normal.  Genitourinary: Vagina normal.  Episiotomy and vaginal repair healing well  Musculoskeletal: Normal range of motion.  Neurological: She is alert and oriented to person, place, and time. She has normal reflexes.  Skin: Skin is warm and dry.  Trace edema in Rt arm, Lt arm is normal  Psychiatric: She has a normal mood and affect. Her behavior is normal. Judgment and thought content normal.  VE: episiotomy and vaginal repair healing well  MAU Course  Procedures [facility]   Assessment and Plan  23 yo G1P1001, 6 days postpartum H/O Hypothyroidism H/O HSV II Anemia Swelling of (right) arm  Discharge Home Increase daily hydration Protein & Iron rich diet Increase daily rest Call  Call for 6  weeks postpartum visit  *Dr. Ernestina Penna notified of plan - agrees  Kenard Gower, MSN, CNM 09/14/2013, 6:22 AM

## 2013-09-20 ENCOUNTER — Ambulatory Visit (HOSPITAL_COMMUNITY)
Admission: RE | Admit: 2013-09-20 | Discharge: 2013-09-20 | Disposition: A | Discharge status: Home / Self Care | Payer: PRIVATE HEALTH INSURANCE | Source: Ambulatory Visit | Attending: Obstetrics & Gynecology | Admitting: Obstetrics & Gynecology

## 2013-09-20 NOTE — Lactation Note (Signed)
Adult Lactation Consultation Outpatient Visit Note  Patient Name: [name](mother)     [name] Date of Birth: 25-May-1991                            DOB: [birth year] Gestational Age at Delivery: 39.1              BIRTH WEIGHT: 6-8.1 Type of Delivery: NVD                               DISCHARGE WEIGHT: 6-3.8                                                                    WEIGHT TODAY:  6-5.7 Breastfeeding History: Frequency of Breastfeeding: EVERY 2-3 HOURS Length of Feeding: 25-30 MINUTES Voids: QS Stools: QS  Supplementing / Method:EBM 60 MLS/2 BOTTLES PER DAY Pumping:  Type of Pump:MEDELA   Frequency:2X/DAY  Volume:  30-60 MLS  Comments:    Consultation Evaluation:Mom is exhausted and not napping during the day.  Discussed importance of getting more rest and taking care of herself.  FOB works long hours and she doesn't have any other support persons to help out.  Mom and baby just came from pediatrician appointment.  Baby is still gaining slowly and they made an ENT referral today for a frenotomy.  Baby was just fed 2 oz per bottle but did go to breast with 24 mm nipple shield for 15 minutes and transferred 32 mls.  Mom states nipple are sore.  Nipples intact with no redness.  Nipples inverted.  Recommended to mom that she pump and bottle feed until frenulum is clipped.  Instructed to pump after baby takes bottle and to give baby 60-90 mls each feeding. Plan is to follow up after frenulum is clipped.  Initial Feeding Assessment:RIGHT BREAST/15 MINUTES Pre-feed [id number]:2884 Post-feed [id number]:1478Weight:2916 Amount Transferred:32 MLS Comments:  Additional Feeding Assessment: Pre-feed Weight: Post-feed Weight: Amount Transferred: Comments:  Additional Feeding Assessment: Pre-feed Weight: Post-feed Weight: Amount Transferred: Comments:  Total Breast milk Transferred this Visit: 32 MLS Total Supplement Given: Baby had 60 mls of EBM just prior to  appointment  Additional Interventions:   Follow-Up Will schedule outpatient appointment after frenotomy     [name], [name] 09/20/2013, 10:27 AM

## 2013-09-27 ENCOUNTER — Ambulatory Visit (HOSPITAL_COMMUNITY): Payer: PRIVATE HEALTH INSURANCE

## 2014-06-16 ENCOUNTER — Encounter (HOSPITAL_COMMUNITY): Payer: Self-pay | Admitting: *Deleted

## 2015-03-05 ENCOUNTER — Encounter (HOSPITAL_COMMUNITY): Payer: Self-pay | Admitting: Emergency Medicine

## 2015-03-05 ENCOUNTER — Emergency Department (HOSPITAL_COMMUNITY): Payer: PRIVATE HEALTH INSURANCE

## 2015-03-05 ENCOUNTER — Emergency Department (HOSPITAL_COMMUNITY)
Admission: EM | Admit: 2015-03-05 | Discharge: 2015-03-05 | Payer: PRIVATE HEALTH INSURANCE | Attending: Emergency Medicine | Admitting: Emergency Medicine

## 2015-03-05 DIAGNOSIS — Z8639 Personal history of other endocrine, nutritional and metabolic disease: Secondary | ICD-10-CM | POA: Diagnosis not present

## 2015-03-05 DIAGNOSIS — Y998 Other external cause status: Secondary | ICD-10-CM | POA: Insufficient documentation

## 2015-03-05 DIAGNOSIS — D649 Anemia, unspecified: Secondary | ICD-10-CM | POA: Insufficient documentation

## 2015-03-05 DIAGNOSIS — S161XXA Strain of muscle, fascia and tendon at neck level, initial encounter: Secondary | ICD-10-CM | POA: Diagnosis not present

## 2015-03-05 DIAGNOSIS — S01511A Laceration without foreign body of lip, initial encounter: Secondary | ICD-10-CM | POA: Insufficient documentation

## 2015-03-05 DIAGNOSIS — K0889 Other specified disorders of teeth and supporting structures: Secondary | ICD-10-CM

## 2015-03-05 DIAGNOSIS — M79641 Pain in right hand: Secondary | ICD-10-CM

## 2015-03-05 DIAGNOSIS — R55 Syncope and collapse: Secondary | ICD-10-CM | POA: Diagnosis not present

## 2015-03-05 DIAGNOSIS — S0993XA Unspecified injury of face, initial encounter: Secondary | ICD-10-CM | POA: Diagnosis present

## 2015-03-05 DIAGNOSIS — F419 Anxiety disorder, unspecified: Secondary | ICD-10-CM | POA: Diagnosis not present

## 2015-03-05 DIAGNOSIS — Z8619 Personal history of other infectious and parasitic diseases: Secondary | ICD-10-CM | POA: Diagnosis not present

## 2015-03-05 DIAGNOSIS — Y92009 Unspecified place in unspecified non-institutional (private) residence as the place of occurrence of the external cause: Secondary | ICD-10-CM | POA: Insufficient documentation

## 2015-03-05 DIAGNOSIS — S032XXA Dislocation of tooth, initial encounter: Secondary | ICD-10-CM | POA: Insufficient documentation

## 2015-03-05 DIAGNOSIS — S6991XA Unspecified injury of right wrist, hand and finger(s), initial encounter: Secondary | ICD-10-CM | POA: Diagnosis not present

## 2015-03-05 DIAGNOSIS — T07XXXA Unspecified multiple injuries, initial encounter: Secondary | ICD-10-CM

## 2015-03-05 DIAGNOSIS — T148 Other injury of unspecified body region: Secondary | ICD-10-CM | POA: Insufficient documentation

## 2015-03-05 DIAGNOSIS — Y9389 Activity, other specified: Secondary | ICD-10-CM | POA: Diagnosis not present

## 2015-03-05 DIAGNOSIS — S0101XA Laceration without foreign body of scalp, initial encounter: Secondary | ICD-10-CM | POA: Diagnosis not present

## 2015-03-05 DIAGNOSIS — K002 Abnormalities of size and form of teeth: Secondary | ICD-10-CM | POA: Diagnosis not present

## 2015-03-05 MED ORDER — NAPROXEN 500 MG PO TABS: 500.0000 mg | ORAL_TABLET | Freq: Two times a day (BID) | ORAL | Status: DC

## 2015-03-05 MED ORDER — BUPIVACAINE-EPINEPHRINE (PF) 0.5% -1:200000 IJ SOLN
1.8000 mL | Freq: Once | INTRAMUSCULAR | Status: AC
  Administered 2015-03-05: 1.8 mL
  Filled 2015-03-05: qty 1.8

## 2015-03-05 MED ORDER — METHOCARBAMOL 500 MG PO TABS: 500.0000 mg | ORAL_TABLET | Freq: Two times a day (BID) | ORAL | Status: DC

## 2015-03-05 MED ORDER — NAPROXEN 500 MG PO TABS
500.0000 mg | ORAL_TABLET | Freq: Once | ORAL | Status: AC
  Administered 2015-03-05: 500 mg via ORAL
  Filled 2015-03-05: qty 1
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
## 2015-03-05 NOTE — ED Notes (Signed)
Patient transported to X-ray 

## 2015-03-05 NOTE — Discharge Instructions (Signed)
Follow-up with Dr. Lucky Cowboy.   Laceration Care, Adult A laceration is a cut or lesion that goes through all layers of the skin and into the tissue just beneath the skin. TREATMENT  Some lacerations may not require closure. Some lacerations may not be able to be closed due to an increased risk of infection. It is important to see your caregiver as soon as possible after an injury to minimize the risk of infection and maximize the opportunity for successful closure. If closure is appropriate, pain medicines may be given, if needed. The wound will be cleaned to help prevent infection. Your caregiver will use stitches (sutures), staples, wound glue (adhesive), or skin adhesive strips to repair the laceration. These tools bring the skin edges together to allow for faster healing and a better cosmetic outcome. However, all wounds will heal with a scar. Once the wound has healed, scarring can be minimized by covering the wound with sunscreen during the day for 1 full year. HOME CARE INSTRUCTIONS  For sutures or staples:  Keep the wound clean and dry.  If you were given a bandage (dressing), you should change it at least once a day. Also, change the dressing if it becomes wet or dirty, or as directed by your caregiver.  Wash the wound with soap and water 2 times a day. Rinse the wound off with water to remove all soap. Pat the wound dry with a clean towel.  After cleaning, apply a thin layer of the antibiotic ointment as recommended by your caregiver. This will help prevent infection and keep the dressing from sticking.  You may shower as usual after the first 24 hours. Do not soak the wound in water until the sutures are removed.  Only take over-the-counter or prescription medicines for pain, discomfort, or fever as directed by your caregiver.  Get your sutures or staples removed as directed by your caregiver. For skin adhesive strips:  Keep the wound clean and dry.  Do not get the skin adhesive  strips wet. You may bathe carefully, using caution to keep the wound dry.  If the wound gets wet, pat it dry with a clean towel.  Skin adhesive strips will fall off on their own. You may trim the strips as the wound heals. Do not remove skin adhesive strips that are still stuck to the wound. They will fall off in time. For wound adhesive:  You may briefly wet your wound in the shower or bath. Do not soak or scrub the wound. Do not swim. Avoid periods of heavy perspiration until the skin adhesive has fallen off on its own. After showering or bathing, gently pat the wound dry with a clean towel.  Do not apply liquid medicine, cream medicine, or ointment medicine to your wound while the skin adhesive is in place. This may loosen the film before your wound is healed.  If a dressing is placed over the wound, be careful not to apply tape directly over the skin adhesive. This may cause the adhesive to be pulled off before the wound is healed.  Avoid prolonged exposure to sunlight or tanning lamps while the skin adhesive is in place. Exposure to ultraviolet light in the first year will darken the scar.  The skin adhesive will usually remain in place for 5 to 10 days, then naturally fall off the skin. Do not pick at the adhesive film. You may need a tetanus shot if:  You cannot remember when you had your last tetanus shot.  You have never had a tetanus shot. If you get a tetanus shot, your arm may swell, get red, and feel warm to the touch. This is common and not a problem. If you need a tetanus shot and you choose not to have one, there is a rare chance of getting tetanus. Sickness from tetanus can be serious. SEEK MEDICAL CARE IF:   You have redness, swelling, or increasing pain in the wound.  You see a red line that goes away from the wound.  You have yellowish-white fluid (pus) coming from the wound.  You have a fever.  You notice a bad smell coming from the wound or dressing.  Your  wound breaks open before or after sutures have been removed.  You notice something coming out of the wound such as wood or glass.  Your wound is on your hand or foot and you cannot move a finger or toe. SEEK IMMEDIATE MEDICAL CARE IF:   Your pain is not controlled with prescribed medicine.  You have severe swelling around the wound causing pain and numbness or a change in color in your arm, hand, leg, or foot.  Your wound splits open and starts bleeding.  You have worsening numbness, weakness, or loss of function of any joint around or beyond the wound.  You develop painful lumps near the wound or on the skin anywhere on your body. MAKE SURE YOU:   Understand these instructions.  Will watch your condition.  Will get help right away if you are not doing well or get worse. Document Released: 08/01/2005 Document Revised: 10/24/2011 Document Reviewed: 01/25/2011 [facility] Patient Information 2015 [place], [place]. This information is not intended to replace advice given to you by your health care provider. Make sure you discuss any questions you have with your health care provider.  Open Wound, Lip An open wound is a break in the skin caused by an injury. An open wound to the lip can be a scrape, cut, or hole (puncture). Good wound care will help:   Lessen pain.  Prevent infection.  Lessen scarring. HOME CARE  Wash off all dirt.  Clean your wounds daily with gentle soap and water.  Eat soft foods or liquids while your wound is healing.  Rinse the wound with salt water after each meal.  Apply medicated cream after the wound has been cleaned as told by your doctor.  Follow up with your doctor as told. GET HELP RIGHT AWAY IF:   There is more redness or puffiness (swelling) in or around the wound.  There is increasing pain.  You have a temperature by mouth above 102 F (38.9 C), not controlled by medicine.  Your baby is older than 3 months with a rectal temperature of  102 F (38.9 C) or higher.  Your baby is 54 months old or younger with a rectal temperature of 100.4 F (38 C) or higher.  There is yellowish white fluid (pus) coming from the wound.  Very bad pain develops that is not controlled with medicine.  There is a red line on the skin above or below the wound. MAKE SURE YOU:   Understand these instructions.  Will watch this condition.  Will get help right away if you are not doing well or get worse. Document Released: 10/28/2008 Document Revised: 10/24/2011 Document Reviewed: 11/17/2009 [facility] Patient Information 2015 [place], [place]. This information is not intended to replace advice given to you by your health care provider. Make sure you discuss any questions you  have with your health care provider.  Muscle Strain A muscle strain is an injury that occurs when a muscle is stretched beyond its normal length. Usually a small number of muscle fibers are torn when this happens. Muscle strain is rated in degrees. First-degree strains have the least amount of muscle fiber tearing and pain. Second-degree and third-degree strains have increasingly more tearing and pain.  Usually, recovery from muscle strain takes 1-2 weeks. Complete healing takes 5-6 weeks.  CAUSES  Muscle strain happens when a sudden, violent force placed on a muscle stretches it too far. This may occur with lifting, sports, or a fall.  RISK FACTORS Muscle strain is especially common in athletes.  SIGNS AND SYMPTOMS At the site of the muscle strain, there may be:  Pain.  Bruising.  Swelling.  Difficulty using the muscle due to pain or lack of normal function. DIAGNOSIS  Your health care provider will perform a physical exam and ask about your medical history. TREATMENT  Often, the best treatment for a muscle strain is resting, icing, and applying cold compresses to the injured area.  HOME CARE INSTRUCTIONS   Use the PRICE method of treatment to promote muscle healing  during the first 2-3 days after your injury. The PRICE method involves:  Protecting the muscle from being injured again.  Restricting your activity and resting the injured body part.  Icing your injury. To do this, put ice in a plastic bag. Place a towel between your skin and the bag. Then, apply the ice and leave it on from 15-20 minutes each hour. After the third day, switch to moist heat packs.  Apply compression to the injured area with a splint or elastic bandage. Be careful not to wrap it too tightly. This may interfere with blood circulation or increase swelling.  Elevate the injured body part above the level of your heart as often as you can.  Only take over-the-counter or prescription medicines for pain, discomfort, or fever as directed by your health care provider.  Warming up prior to exercise helps to prevent future muscle strains. SEEK MEDICAL CARE IF:   You have increasing pain or swelling in the injured area.  You have numbness, tingling, or a significant loss of strength in the injured area. MAKE SURE YOU:   Understand these instructions.  Will watch your condition.  Will get help right away if you are not doing well or get worse. Document Released: 08/01/2005 Document Revised: 05/22/2013 Document Reviewed: 02/28/2013 [facility] Patient Information 2015 [place], [place]. This information is not intended to replace advice given to you by your health care provider. Make sure you discuss any questions you have with your health care provider.

## 2015-03-05 NOTE — ED Notes (Signed)
Charge called pt and gave her phone number and address for Dr Lucky Cowboy.

## 2015-03-05 NOTE — ED Notes (Signed)
Charge nurse has been working on this issue for several hours now, pt called Dr Jeanice Lim office, he is not the on call dentist for this week. Pt was told she could not make an appointment. Pt called back to ED. Charge rn called Dr Jeanice Lim office and was told to contact the on call dentist. Charge called on call dentist Dr Lucky Cowboy, was told for them to be able to schedule an appointment with pt that they needed an AVS with discharge referral to Dr Lucky Cowboy. Charge got PA to change discharge referral, and will call back pt to let her know who she can contact to schedule an appointment with.
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
## 2015-03-05 NOTE — ED Notes (Signed)
Pt being sutured

## 2015-03-05 NOTE — ED Notes (Signed)
C-collar applied

## 2015-03-05 NOTE — ED Notes (Signed)
Patient transported to CT 

## 2015-03-05 NOTE — ED Notes (Signed)
MD at bedside. 

## 2015-03-13 ENCOUNTER — Encounter (HOSPITAL_COMMUNITY): Payer: Self-pay | Admitting: Emergency Medicine

## 2015-03-13 ENCOUNTER — Emergency Department (HOSPITAL_COMMUNITY)
Admission: EM | Admit: 2015-03-13 | Discharge: 2015-03-13 | Disposition: A | Discharge status: Home / Self Care | Payer: PRIVATE HEALTH INSURANCE | Attending: Emergency Medicine | Admitting: Emergency Medicine

## 2015-03-13 DIAGNOSIS — Z8639 Personal history of other endocrine, nutritional and metabolic disease: Secondary | ICD-10-CM | POA: Diagnosis not present

## 2015-03-13 DIAGNOSIS — M546 Pain in thoracic spine: Secondary | ICD-10-CM

## 2015-03-13 DIAGNOSIS — Z8619 Personal history of other infectious and parasitic diseases: Secondary | ICD-10-CM | POA: Diagnosis not present

## 2015-03-13 DIAGNOSIS — Z79899 Other long term (current) drug therapy: Secondary | ICD-10-CM | POA: Diagnosis not present

## 2015-03-13 DIAGNOSIS — Z4802 Encounter for removal of sutures: Secondary | ICD-10-CM | POA: Insufficient documentation

## 2015-03-13 DIAGNOSIS — M542 Cervicalgia: Secondary | ICD-10-CM | POA: Insufficient documentation

## 2015-03-13 DIAGNOSIS — Z862 Personal history of diseases of the blood and blood-forming organs and certain disorders involving the immune mechanism: Secondary | ICD-10-CM | POA: Diagnosis not present

## 2015-03-13 MED ORDER — OXYCODONE-ACETAMINOPHEN 5-325 MG PO TABS: 2.0000 | ORAL_TABLET | ORAL | Status: DC | PRN

## 2015-03-13 NOTE — Discharge Instructions (Signed)
Suture Removal, Care After Follow up with a primary care provider using the resource guide below.  Refer to this sheet in the next few weeks. These instructions provide you with information on caring for yourself after your procedure. Your health care provider may also give you more specific instructions. Your treatment has been planned according to current medical practices, but problems sometimes occur. Call your health care provider if you have any problems or questions after your procedure. WHAT TO EXPECT AFTER THE PROCEDURE After your stitches (sutures) are removed, it is typical to have the following:  Some discomfort and swelling in the wound area.  Slight redness in the area. HOME CARE INSTRUCTIONS   If you have skin adhesive strips over the wound area, do not take the strips off. They will fall off on their own in a few days. If the strips remain in place after 14 days, you may remove them.  Change any bandages (dressings) at least once a day or as directed by your health care provider. If the bandage sticks, soak it off with warm, soapy water.  Apply cream or ointment only as directed by your health care provider. If using cream or ointment, wash the area with soap and water 2 times a day to remove all the cream or ointment. Rinse off the soap and pat the area dry with a clean towel.  Keep the wound area dry and clean. If the bandage becomes wet or dirty, or if it develops a bad smell, change it as soon as possible.  Continue to protect the wound from injury.  Use sunscreen when out in the sun. New scars become sunburned easily. SEEK MEDICAL CARE IF:  You have increasing redness, swelling, or pain in the wound.  You see pus coming from the wound.  You have a fever.  You notice a bad smell coming from the wound or dressing.  Your wound breaks open (edges not staying together). Document Released: 04/26/2001 Document Revised: 05/22/2013 Document Reviewed: 03/13/2013 [facility]  Patient Information 2015 [place], [place]. This information is not intended to replace advice given to you by your health care provider. Make sure you discuss any questions you have with your health care provider.  Emergency Department Resource Guide 1) Find a Doctor and Pay Out of Pocket Although you won't have to find out who is covered by your insurance plan, it is a good idea to ask around and get recommendations. You will then need to call the office and see if the doctor you have chosen will accept you as a new patient and what types of options they offer for patients who are self-pay. Some doctors offer discounts or will set up payment plans for their patients who do not have insurance, but you will need to ask so you aren't surprised when you get to your appointment.  2) Contact Your Local Health Department Not all health departments have doctors that can see patients for sick visits, but many do, so it is worth a call to see if yours does. If you don't know where your local health department is, you can check in your phone book. The CDC also has a tool to help you locate your state's health department, and many state websites also have listings of all of their local health departments.  3) Find a Walk-in Clinic If your illness is not likely to be very severe or complicated, you may want to try a walk in clinic. These are popping up all over the  country in pharmacies, drugstores, and shopping centers. They're usually staffed by nurse practitioners or physician assistants that have been trained to treat common illnesses and complaints. They're usually fairly quick and inexpensive. However, if you have serious medical issues or chronic medical problems, these are probably not your best option.  No Primary Care Doctor: - Call Health Connect at  [phone] - they can help you locate a primary care doctor that  accepts your insurance, provides certain services, etc. - Physician Referral Service-  [phone]  Chronic Pain Problems: Organization         Address  Phone   Notes  [facility]  [phone] Patients need to be referred by their primary care doctor.   Medication Assistance: Organization         Address  Phone   Notes  [facility] Medication [facility] [place] [place], [place] [address] [phone] --Must be a resident of [facility] -- Must have NO insurance coverage whatsoever (no Medicaid/ Medicare, etc.) -- The pt. MUST have a primary care doctor that directs their care regularly and follows them in the community   [facility]  [phone]   [employer]  [phone]    Agencies that provide inexpensive medical care: Organization         Address  Phone   Notes  [facility]  [phone]   [facility]    [phone]   [facility] [place], [place] [address] [phone]   [facility] [address] [place] [phone]   [facility]anned Parenthood    [phone]   [facility]    [phone]   [place] [address], [place] Phone:  [phone], Fax:  [phone] Hours of Operation:  9 am - 6 pm, M-F.  Also accepts Medicaid/Medicare and self-pay.  [facility] [place] for [place], [address], [place] Phone: [phone], Fax: [phone]. Hours of Operation:  8:30 am - 5:30 pm, M-F.  Also accepts Medicaid and self-pay.  [facility] [place] [address], [place] Phone: [phone]   [place], [place], [place] [phone], Ext. [phone] Mondays & Thursdays: 7-9 AM.  First 15 patients are seen on a first come, first serve basis.    [place] Providers:  Organization         Address  Phone   Notes  [facility] [address] [place] [address],  [place] [phone] Also accepts self-pay patients.  [facility] [address], [place]  [phone]   [place], [address], [place] [phone]   [facility] [address] [place] [phone]   Lucianne Lei [address], [place]   [phone] Only accepts [place] patients after they have their name applied to their card.   Self-Pay (no insurance) in [facility]:  Organization         Address  Phone   Notes  Sickle Cell Patients, [facility] [place] [phone]   [facility] [place] [phone]   [facility] [place]  [place] [address], [place] [phone]) [phone]   °[facility]/Dr. Osei-Bonsu ° [address] [place] [address], [place] or [address], [place] [phone]) [phone] Phone number for both [place] and [place] locations is the same.  °[facility] [address], [place] ([phone]) [phone]   °[facility] [address] [place] [address], [place] or [address] ([phone]) [phone] °(336) [phone]   °[facility] [address], [place] ([phone]) [phone], phone; [phone], fax Sees patients 1st and 3rd Saturday of every month.  Must not qualify for public or private insurance (i.e. Medicaid, Medicare, [place] Health Choice, Veterans' Benefits) • Household income should be no more than 200% of the poverty level •The clinic cannot treat you if you are pregnant or think you are pregnant • Sexually transmitted diseases are not treated at the clinic.  ° ° °Dental Care: °Organization         Address  Phone  Notes  °[facility] [address], [place] ([phone]) [phone] Accepts children up to age 21 who are enrolled in  Medicaid or [place] Health Choice; pregnant women with a Medicaid card; and children who have applied for Medicaid or [place] Health Choice, but were declined, whose parents can pay a reduced fee at time of service.  °[facility] [place]  [address], [place] ([phone]) [phone] Accepts children up to age 21 who are enrolled in Medicaid or [place] Health Choice; pregnant women with a Medicaid card; and children who have applied for Medicaid or [place] Health Choice, but were declined, whose parents can pay a reduced fee at time of service.  °[facility] ° [address], [place] [phone]) [phone] Patients are seen by appointment only. Walk-ins are not accepted. [facility] will see patients 18 years of age and older. °Monday - Tuesday (8am-5pm) °Most Wednesdays (8:30-5pm) °$30 per visit, cash only  °[facility] ° [address], [place] [phone]) [phone] Patients are seen by appointment only. Walk-ins are not accepted. [facility] will see patients 18 years of age and older. °One Wednesday Evening (Monthly: Volunteer Based).  $30 per visit, cash only  °[facility]  [phone] for adults; Children under age 4, call Graduate Pediatric Dentistry at [phone]. Children aged 4-14, please call [phone] to request a pediatric application. ° Dental services are provided in all areas of dental care including fillings, crowns and bridges, complete and partial dentures, implants, gum treatment, root canals, and extractions. Preventive care is also provided. Treatment is provided to both adults and children. °Patients are selected via a lottery and there is often a waiting list. °  °[facility] [address], °[place] ° (336) [phone] [email or web address] °  °[facility] [address], [place],  ([phone], Ext. [phone] Second and Fourth Thursday of each month, opens at 6:30  AM; Clinic ends at 9 AM.  Patients are seen on a first-come first-served basis, and a limited number are seen during each clinic.  ° °[facility] ° [address], [place],  ([phone]) [phone]   Eligibility Requirements °You must have lived in [place], [place], or [place] counties for at least the last three months. °  You cannot be eligible for state or federal sponsored healthcare insurance, including Veterans Administration, Medicaid, or Medicare. °    You generally cannot be eligible for healthcare insurance through your employer.  °  How to apply: °Eligibility screenings are held every Tuesday and Wednesday afternoon from 1:00 pm until 4:00 pm. You do not need an appointment for the interview!  °[facility] [address], [place], [place] [phone]   °[facility]  336-342-8273   °[facility]  [phone]   °[facility]  336-570-6415   ° °Behavioral Health Resources in the Community: °Intensive Outpatient Programs °Organization         Address  Phone  Notes  °[facility] [address], [place], [place] [phone]   °[facility] [address], [place], [place] [phone]   °[facility] [address], [place], [place] ° 336-882-2125   °[facility] [address],  °[place], [place] 1-800-853-5163 or 336-641-4981   °Substance Abuse Resources °Organization         Address  Phone  Notes  °[facility]  336-882-2125   °[facility]  336-784-9470   °[facility]  336-285-9073   °[facility]  336-845-3988   °Residential & Outpatient Substance Abuse Program  1-800-659-3381   °Psychological Services °Organization         Address  Phone  Notes  °[facility]  336- 832-9600   °[facility]  336- 378-7881   °[facility] [address], [place] 1-800-853-5163 or  336-641-4981   ° °Mobile Crisis Teams °Organization         Address  Phone  Notes  °[facility], Mobile Crisis Care Unit  1-877-626-1772   °Assertive °[facility] ° [address] [place], [place] 336-834-9664   °Sharon DeEsch [address] °[place] 336-554-5454   ° °Self-Help/Support Groups °Organization         Address  Phone             Notes  °[facility] [place] - variety of support groups  336- 373-1402 Call for more information  °Narcotics Anonymous (NA), [facility] [address], °[place]  2 meetings at this location  ° °Residential Treatment Programs °Organization         Address  Phone  Notes  °[facility] [address],    °[place]  1-866-801-8205   °[facility] ° [address], [place], [place] [phone]   °[facility] [address], [place] 336-845-3988 Admissions: 8am-3pm M-F  °[facility] [address]    °[place], [place] 336-841-1104   °[facility] [address], [place], [place] 336-379-7146   °[facility] [address]  °[place], [place] 336-285-9073   °[facility] - Intensive Outpatient [address], [place], [place] 336-852-3033   °[facility] [address]  °[place], [place] 1-877-615-2722 or 336-784-9470   °[facility] ([facility]) [address] [place], [place] 336-227-7417 Accepts Medicaid  °[facility] [address]  °[place] 1-800-659-3381 Substance Abuse/Addiction Treatment  ° °[place] [facility] Resources °Organization         Address  Phone  Notes  °[facility]  (888) 581-9988   °Julie Brannon, PhD [address] [place], [place]   (336) 349-5553 or (336) 951-0000   °[facility]   [address] °[place], [place] (336) 349-4454   °[facility] [address],  [place], [place] (336) 342-8316 Insurance/Medicaid/sponsorship through [facility]  °[facility] [address]                                    [place], [place] (336) [phone] Therapy/tele-psych/case  °[facility] [address]  ° [place], [place] (336) 349-2233    °Dr. Arfeen  (336) 349-4544   °[facility] [place] [facility]  [employer] [facility] 1) [address], [place] °2) [address], [place] °3)  [address], [place] (336) 349-3220 °(336) 342-7768 ° °(336) 342-8140   °[place] [facility] (336) 342-1394 or (336) 342-3537 (After Hours)    ° ° ° °
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
## 2015-03-13 NOTE — ED Notes (Signed)
Pt here for lip suture removal and posterior head staple removal. Stitches/sutures put in on 7/21, 8 days ago. No purulent drainage. Also reports pain from bump on R lower back. Also reports ongoing ringing in R ear.

## 2019-06-07 ENCOUNTER — Other Ambulatory Visit (HOSPITAL_COMMUNITY)
Admission: RE | Admit: 2019-06-07 | Discharge: 2019-06-07 | Disposition: A | Discharge status: Home / Self Care | Payer: Medicaid Other | Source: Ambulatory Visit | Attending: Obstetrics | Admitting: Obstetrics

## 2019-06-07 ENCOUNTER — Ambulatory Visit: Payer: Medicaid Other | Admitting: Obstetrics

## 2019-06-07 ENCOUNTER — Encounter: Payer: Self-pay | Admitting: Obstetrics

## 2019-06-07 ENCOUNTER — Other Ambulatory Visit: Payer: Self-pay

## 2019-06-07 VITALS — BP 102/70 | HR 64 | Ht 66.5 in | Wt 167.0 lb

## 2019-06-07 DIAGNOSIS — Z113 Encounter for screening for infections with a predominantly sexual mode of transmission: Secondary | ICD-10-CM | POA: Diagnosis not present

## 2019-06-07 DIAGNOSIS — Z01419 Encounter for gynecological examination (general) (routine) without abnormal findings: Secondary | ICD-10-CM

## 2019-06-07 DIAGNOSIS — Z0000 Encounter for general adult medical examination without abnormal findings: Secondary | ICD-10-CM | POA: Diagnosis not present

## 2019-06-07 DIAGNOSIS — A6000 Herpesviral infection of urogenital system, unspecified: Secondary | ICD-10-CM

## 2019-06-07 DIAGNOSIS — L680 Hirsutism: Secondary | ICD-10-CM | POA: Diagnosis not present

## 2019-06-07 DIAGNOSIS — H6500 Acute serous otitis media, unspecified ear: Secondary | ICD-10-CM

## 2019-06-07 DIAGNOSIS — N944 Primary dysmenorrhea: Secondary | ICD-10-CM

## 2019-06-07 DIAGNOSIS — N898 Other specified noninflammatory disorders of vagina: Secondary | ICD-10-CM | POA: Insufficient documentation

## 2019-06-07 DIAGNOSIS — Z3009 Encounter for other general counseling and advice on contraception: Secondary | ICD-10-CM

## 2019-06-07 DIAGNOSIS — Z30015 Encounter for initial prescription of vaginal ring hormonal contraceptive: Secondary | ICD-10-CM

## 2019-06-07 MED ORDER — AMOXICILLIN-POT CLAVULANATE 875-125 MG PO TABS: 1.0000 | ORAL_TABLET | Freq: Two times a day (BID) | ORAL | 2 refills | Status: AC

## 2019-06-07 MED ORDER — IBUPROFEN 800 MG PO TABS: 800.0000 mg | ORAL_TABLET | Freq: Three times a day (TID) | ORAL | 5 refills | Status: AC | PRN

## 2019-06-07 MED ORDER — VALACYCLOVIR HCL 500 MG PO TABS: ORAL_TABLET | ORAL | 11 refills | Status: AC

## 2019-06-07 MED ORDER — ETONOGESTREL-ETHINYL ESTRADIOL 0.12-0.015 MG/24HR VA RING: VAGINAL_RING | VAGINAL | 12 refills | Status: AC

## 2019-06-07 MED ORDER — PRENATAL PLUS 27-1 MG PO TABS: 1.0000 | ORAL_TABLET | Freq: Every day | ORAL | 4 refills | Status: AC
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
## 2019-06-09 LAB — TESTOSTERONE, FREE, TOTAL, SHBG
Sex Hormone Binding: 32.8 nmol/L (ref 24.6–122.0)
Testosterone, Free: 2.5 pg/mL (ref 0.0–4.2)
Testosterone: 38 ng/dL (ref 8–48)

## 2019-06-11 LAB — CYTOLOGY - PAP
Comment: NEGATIVE
Diagnosis: NEGATIVE
High risk HPV: NEGATIVE

## 2019-06-12 LAB — CBC
Hematocrit: 42.3 % (ref 34.0–46.6)
Hemoglobin: 13.6 g/dL (ref 11.1–15.9)
MCH: 29 pg (ref 26.6–33.0)
MCHC: 32.2 g/dL (ref 31.5–35.7)
MCV: 90 fL (ref 79–97)
Platelets: 353 10*3/uL (ref 150–450)
RBC: 4.69 x10E6/uL (ref 3.77–5.28)
RDW: 12.4 % (ref 11.7–15.4)
WBC: 6.2 10*3/uL (ref 3.4–10.8)

## 2019-06-12 LAB — COMPREHENSIVE METABOLIC PANEL
ALT: 21 IU/L (ref 0–32)
AST: 19 IU/L (ref 0–40)
Albumin/Globulin Ratio: 1.7 (ref 1.2–2.2)
Albumin: 4.4 g/dL (ref 3.9–5.0)
Alkaline Phosphatase: 92 IU/L (ref 39–117)
BUN/Creatinine Ratio: 14 (ref 9–23)
BUN: 14 mg/dL (ref 6–20)
Bilirubin Total: 0.3 mg/dL (ref 0.0–1.2)
CO2: 27 mmol/L (ref 20–29)
Calcium: 9.9 mg/dL (ref 8.7–10.2)
Chloride: 101 mmol/L (ref 96–106)
Creatinine, Ser: 1 mg/dL (ref 0.57–1.00)
GFR calc Af Amer: 87 mL/min/{1.73_m2} (ref >59)
GFR calc non Af Amer: 76 mL/min/{1.73_m2} (ref >59)
Globulin, Total: 2.6 g/dL (ref 1.5–4.5)
Glucose: 86 mg/dL (ref 65–99)
Potassium: 4.4 mmol/L (ref 3.5–5.2)
Sodium: 139 mmol/L (ref 134–144)
Total Protein: 7 g/dL (ref 6.0–8.5)

## 2019-06-12 LAB — VITAMIN D 1,25 DIHYDROXY
Vitamin D 1, 25 (OH)2 Total: 51 pg/mL
Vitamin D2 1, 25 (OH)2: <10 pg/mL
Vitamin D3 1, 25 (OH)2: 51 pg/mL

## 2019-06-12 LAB — TSH: TSH: 1.6 u[IU]/mL (ref 0.450–4.500)

## 2019-06-12 LAB — HEPATITIS B SURFACE ANTIGEN: Hepatitis B Surface Ag: NEGATIVE

## 2019-06-12 LAB — HIV ANTIBODY (ROUTINE TESTING W REFLEX): HIV Screen 4th Generation wRfx: NONREACTIVE

## 2019-06-12 LAB — CHOLESTEROL, TOTAL: Cholesterol, Total: 169 mg/dL (ref 100–199)

## 2019-06-12 LAB — RPR: RPR Ser Ql: NONREACTIVE

## 2019-06-12 LAB — HEMOGLOBIN A1C
Est. average glucose Bld gHb Est-mCnc: 105 mg/dL
Hgb A1c MFr Bld: 5.3 % (ref 4.8–5.6)

## 2019-06-12 LAB — TRIGLYCERIDES: Triglycerides: 42 mg/dL (ref 0–149)

## 2019-06-12 LAB — HDL CHOLESTEROL: HDL: 68 mg/dL (ref >39)

## 2019-06-12 LAB — LDL CHOLESTEROL, DIRECT: LDL Direct: 99 mg/dL (ref 0–99)

## 2019-06-12 LAB — HEPATITIS C ANTIBODY: Hep C Virus Ab: <0.1 s/co ratio (ref 0.0–0.9)

## 2019-06-13 LAB — CERVICOVAGINAL ANCILLARY ONLY
Bacterial Vaginitis (gardnerella): NEGATIVE
Candida Glabrata: NEGATIVE
Candida Vaginitis: NEGATIVE
Chlamydia: NEGATIVE
Comment: NEGATIVE
Comment: NEGATIVE
Comment: NEGATIVE
Comment: NEGATIVE
Comment: NEGATIVE
Comment: NORMAL
Neisseria Gonorrhea: NEGATIVE
Trichomonas: NEGATIVE

## 2019-06-18 ENCOUNTER — Ambulatory Visit: Payer: PRIVATE HEALTH INSURANCE

## 2019-06-18 ENCOUNTER — Ambulatory Visit: Payer: PRIVATE HEALTH INSURANCE | Admitting: Obstetrics and Gynecology

## 2019-10-11 DIAGNOSIS — H60331 Swimmer's ear, right ear: Secondary | ICD-10-CM | POA: Diagnosis not present

## 2019-11-23 DIAGNOSIS — Z23 Encounter for immunization: Secondary | ICD-10-CM | POA: Diagnosis not present

## 2020-02-26 DIAGNOSIS — Z124 Encounter for screening for malignant neoplasm of cervix: Secondary | ICD-10-CM | POA: Diagnosis not present

## 2020-02-26 DIAGNOSIS — Z1151 Encounter for screening for human papillomavirus (HPV): Secondary | ICD-10-CM | POA: Diagnosis not present

## 2020-03-04 DIAGNOSIS — H60501 Unspecified acute noninfective otitis externa, right ear: Secondary | ICD-10-CM | POA: Diagnosis not present

## 2020-03-23 DIAGNOSIS — J029 Acute pharyngitis, unspecified: Secondary | ICD-10-CM | POA: Diagnosis not present

## 2020-03-23 DIAGNOSIS — R05 Cough: Secondary | ICD-10-CM | POA: Diagnosis not present

## 2020-03-23 DIAGNOSIS — J069 Acute upper respiratory infection, unspecified: Secondary | ICD-10-CM | POA: Diagnosis not present

## 2020-03-23 DIAGNOSIS — Z20828 Contact with and (suspected) exposure to other viral communicable diseases: Secondary | ICD-10-CM | POA: Diagnosis not present

## 2020-05-28 ENCOUNTER — Ambulatory Visit (HOSPITAL_COMMUNITY): Admit: 2020-05-28 | Disposition: A | Discharge status: Other Acute Inpatient Hospital | Payer: Medicaid Other

## 2020-05-28 DIAGNOSIS — R110 Nausea: Secondary | ICD-10-CM | POA: Diagnosis not present

## 2020-05-28 DIAGNOSIS — J029 Acute pharyngitis, unspecified: Secondary | ICD-10-CM | POA: Diagnosis not present

## 2020-05-28 DIAGNOSIS — N3000 Acute cystitis without hematuria: Secondary | ICD-10-CM | POA: Diagnosis not present

## 2020-05-28 DIAGNOSIS — M545 Low back pain, unspecified: Secondary | ICD-10-CM | POA: Diagnosis not present

## 2020-06-17 DIAGNOSIS — Z20822 Contact with and (suspected) exposure to covid-19: Secondary | ICD-10-CM | POA: Diagnosis not present

## 2020-06-17 DIAGNOSIS — R059 Cough, unspecified: Secondary | ICD-10-CM | POA: Diagnosis not present

## 2020-06-17 DIAGNOSIS — R0981 Nasal congestion: Secondary | ICD-10-CM | POA: Diagnosis not present

## 2020-06-17 DIAGNOSIS — J069 Acute upper respiratory infection, unspecified: Secondary | ICD-10-CM | POA: Diagnosis not present

## 2020-11-20 DIAGNOSIS — L0291 Cutaneous abscess, unspecified: Secondary | ICD-10-CM | POA: Diagnosis not present

## 2020-11-20 DIAGNOSIS — J029 Acute pharyngitis, unspecified: Secondary | ICD-10-CM | POA: Diagnosis not present

## 2020-11-20 DIAGNOSIS — R52 Pain, unspecified: Secondary | ICD-10-CM | POA: Diagnosis not present

## 2020-11-20 DIAGNOSIS — R509 Fever, unspecified: Secondary | ICD-10-CM | POA: Diagnosis not present

## 2020-11-20 DIAGNOSIS — Z20822 Contact with and (suspected) exposure to covid-19: Secondary | ICD-10-CM | POA: Diagnosis not present

## 2020-11-26 DIAGNOSIS — Z6827 Body mass index (BMI) 27.0-27.9, adult: Secondary | ICD-10-CM | POA: Diagnosis not present

## 2020-11-26 DIAGNOSIS — E663 Overweight: Secondary | ICD-10-CM | POA: Diagnosis not present

## 2020-11-26 DIAGNOSIS — Z0000 Encounter for general adult medical examination without abnormal findings: Secondary | ICD-10-CM | POA: Diagnosis not present

## 2020-11-26 DIAGNOSIS — Z124 Encounter for screening for malignant neoplasm of cervix: Secondary | ICD-10-CM | POA: Diagnosis not present

## 2020-11-26 DIAGNOSIS — Z3044 Encounter for surveillance of vaginal ring hormonal contraceptive device: Secondary | ICD-10-CM | POA: Diagnosis not present

## 2020-11-26 DIAGNOSIS — Z113 Encounter for screening for infections with a predominantly sexual mode of transmission: Secondary | ICD-10-CM | POA: Diagnosis not present

## 2020-11-26 DIAGNOSIS — E042 Nontoxic multinodular goiter: Secondary | ICD-10-CM | POA: Diagnosis not present

## 2020-11-26 DIAGNOSIS — E039 Hypothyroidism, unspecified: Secondary | ICD-10-CM | POA: Diagnosis not present

## 2020-12-08 DIAGNOSIS — E042 Nontoxic multinodular goiter: Secondary | ICD-10-CM | POA: Diagnosis not present

## 2020-12-14 DIAGNOSIS — M542 Cervicalgia: Secondary | ICD-10-CM | POA: Diagnosis not present

## 2020-12-14 DIAGNOSIS — W19XXXA Unspecified fall, initial encounter: Secondary | ICD-10-CM | POA: Diagnosis not present

## 2020-12-14 DIAGNOSIS — M549 Dorsalgia, unspecified: Secondary | ICD-10-CM | POA: Diagnosis not present

## 2020-12-14 DIAGNOSIS — M5459 Other low back pain: Secondary | ICD-10-CM | POA: Diagnosis not present

## 2020-12-16 ENCOUNTER — Ambulatory Visit: Payer: Medicaid Other | Admitting: Women's Health

## 2021-01-22 ENCOUNTER — Emergency Department (HOSPITAL_BASED_OUTPATIENT_CLINIC_OR_DEPARTMENT_OTHER)
Admission: EM | Admit: 2021-01-22 | Discharge: 2021-01-22 | Disposition: A | Discharge status: Home / Self Care | Payer: Medicaid Other | Attending: Emergency Medicine | Admitting: Emergency Medicine

## 2021-01-22 ENCOUNTER — Emergency Department (HOSPITAL_BASED_OUTPATIENT_CLINIC_OR_DEPARTMENT_OTHER): Payer: Medicaid Other

## 2021-01-22 ENCOUNTER — Other Ambulatory Visit: Payer: Self-pay

## 2021-01-22 ENCOUNTER — Encounter (HOSPITAL_BASED_OUTPATIENT_CLINIC_OR_DEPARTMENT_OTHER): Payer: Self-pay | Admitting: Emergency Medicine

## 2021-01-22 DIAGNOSIS — K5900 Constipation, unspecified: Secondary | ICD-10-CM | POA: Diagnosis not present

## 2021-01-22 DIAGNOSIS — R197 Diarrhea, unspecified: Secondary | ICD-10-CM | POA: Diagnosis not present

## 2021-01-22 DIAGNOSIS — R110 Nausea: Secondary | ICD-10-CM | POA: Insufficient documentation

## 2021-01-22 DIAGNOSIS — R140 Abdominal distension (gaseous): Secondary | ICD-10-CM | POA: Diagnosis not present

## 2021-01-22 DIAGNOSIS — E039 Hypothyroidism, unspecified: Secondary | ICD-10-CM | POA: Diagnosis not present

## 2021-01-22 DIAGNOSIS — R1084 Generalized abdominal pain: Secondary | ICD-10-CM

## 2021-01-22 DIAGNOSIS — R109 Unspecified abdominal pain: Secondary | ICD-10-CM | POA: Diagnosis not present

## 2021-01-22 LAB — COMPREHENSIVE METABOLIC PANEL
ALT: 26 U/L (ref 0–44)
AST: 17 U/L (ref 15–41)
Albumin: 4.2 g/dL (ref 3.5–5.0)
Alkaline Phosphatase: 74 U/L (ref 38–126)
Anion gap: 9 (ref 5–15)
BUN: 10 mg/dL (ref 6–20)
CO2: 25 mmol/L (ref 22–32)
Calcium: 8.8 mg/dL — ABNORMAL LOW (ref 8.9–10.3)
Chloride: 105 mmol/L (ref 98–111)
Creatinine, Ser: 0.91 mg/dL (ref 0.44–1.00)
GFR, Estimated: >60 mL/min (ref >60)
Glucose, Bld: 78 mg/dL (ref 70–99)
Potassium: 4 mmol/L (ref 3.5–5.1)
Sodium: 139 mmol/L (ref 135–145)
Total Bilirubin: 0.4 mg/dL (ref 0.3–1.2)
Total Protein: 6.6 g/dL (ref 6.5–8.1)

## 2021-01-22 LAB — CBC WITH DIFFERENTIAL/PLATELET
Abs Immature Granulocytes: 0.02 10*3/uL (ref 0.00–0.07)
Basophils Absolute: 0.1 10*3/uL (ref 0.0–0.1)
Basophils Relative: 1 %
Eosinophils Absolute: 0.1 10*3/uL (ref 0.0–0.5)
Eosinophils Relative: 2 %
HCT: 43.4 % (ref 36.0–46.0)
Hemoglobin: 14 g/dL (ref 12.0–15.0)
Immature Granulocytes: 0 %
Lymphocytes Relative: 34 %
Lymphs Abs: 2.8 10*3/uL (ref 0.7–4.0)
MCH: 29.7 pg (ref 26.0–34.0)
MCHC: 32.3 g/dL (ref 30.0–36.0)
MCV: 91.9 fL (ref 80.0–100.0)
Monocytes Absolute: 0.5 10*3/uL (ref 0.1–1.0)
Monocytes Relative: 7 %
Neutro Abs: 4.7 10*3/uL (ref 1.7–7.7)
Neutrophils Relative %: 56 %
Platelets: 288 10*3/uL (ref 150–400)
RBC: 4.72 MIL/uL (ref 3.87–5.11)
RDW: 13.2 % (ref 11.5–15.5)
WBC: 8.3 10*3/uL (ref 4.0–10.5)
nRBC: 0 % (ref 0.0–0.2)

## 2021-01-22 LAB — URINALYSIS, ROUTINE W REFLEX MICROSCOPIC
Bilirubin Urine: NEGATIVE
Glucose, UA: NEGATIVE mg/dL
Hgb urine dipstick: NEGATIVE
Ketones, ur: NEGATIVE mg/dL
Leukocytes,Ua: NEGATIVE
Nitrite: NEGATIVE
Protein, ur: NEGATIVE mg/dL
Specific Gravity, Urine: 1.019 (ref 1.005–1.030)
pH: 6.5 (ref 5.0–8.0)

## 2021-01-22 LAB — LIPASE, BLOOD: Lipase: 23 U/L (ref 11–51)

## 2021-01-22 LAB — PREGNANCY, URINE: Preg Test, Ur: NEGATIVE

## 2021-01-22 MED ORDER — IOHEXOL 300 MG/ML  SOLN
75.0000 mL | Freq: Once | INTRAMUSCULAR | Status: AC | PRN
  Administered 2021-01-22: 75 mL via INTRAVENOUS

## 2021-01-22 NOTE — ED Triage Notes (Signed)
Pt sent from PCP. Pt is due to have her liver enzymes rechecked within the next week.  to have Pt complains of intermittent abdominal pain for the past 3 to 4 days. Pt states tenderness throughout her whole abdominal area.   Pt has nausea but denis emesis.

## 2021-01-22 NOTE — ED Notes (Signed)
Pt verbalizes understanding of discharge instructions. Opportunity for questioning and answers were provided. Armand removed by staff, pt discharged from ED to home. Educated to log in to [employer] for results.

## 2021-01-22 NOTE — ED Provider Notes (Signed)
[facility] EMERGENCY DEPT Provider Note   CSN: [id number] Arrival date & time: 01/22/21  1543     History Chief Complaint  Patient presents with   Abdominal Pain    [name] is a 30 y.o. female.  Seen by PCP today and sent for evaluation.   The history is provided by the patient.  Abdominal Pain Pain location:  Generalized Pain quality: bloating and throbbing   Pain radiates to:  Does not radiate Pain severity:  Moderate Onset quality:  Gradual Duration:  4 days Timing:  Intermittent Progression:  Waxing and waning Chronicity:  New Context: not diet changes, not previous surgeries, not recent illness, not sick contacts and not trauma   Relieved by:  Nothing Exacerbated by: got worse after eating falafel today. Ineffective treatments:  OTC medications Associated symptoms: constipation, diarrhea and nausea   Associated symptoms: no chest pain, no chills, no cough, no dysuria, no fever, no hematuria, no shortness of breath, no sore throat and no vomiting   Associated symptoms comment:  Thought she was constipated, took a laxative, and now she is having loose stools     Past Medical History:  Diagnosis Date   Anemia    Herpes    Hypothyroidism    Postpartum care following vaginal delivery (1/25) 09/09/2013   Vaginal Pap smear, abnormal     Patient Active Problem List   Diagnosis Date Noted   Postpartum care following vaginal delivery (1/25) 09/09/2013   Normal labor 09/08/2013    Past Surgical History:  Procedure Laterality Date   TONSILLECTOMY     WISDOM TOOTH EXTRACTION       OB History     Gravida  1   Para  1   Term  1   Preterm      AB      Living  1      SAB      IAB      Ectopic      Multiple      Live Births  1           Family History  Problem Relation Age of Onset   Heart disease Maternal Grandmother    Cancer Paternal Grandmother    Cancer Maternal Aunt     Social History   Tobacco Use    Smoking status: Never   Smokeless tobacco: Never  Vaping Use   Vaping Use: Never used  Substance Use Topics   Alcohol use: Yes    Comment: ocasional   Drug use: No    Home Medications Prior to Admission medications   Medication Sig Start Date End Date Taking? Authorizing Provider  amoxicillin-clavulanate (AUGMENTIN) 875-125 MG tablet Take 1 tablet by mouth 2 (two) times daily. 1 tablet po BID x10 days 06/07/19   Brock Bad, MD  etonogestrel-ethinyl estradiol (NUVARING) 0.12-0.015 MG/24HR vaginal ring Insert vaginally and leave in place for 3 consecutive weeks, then remove for 1 week. 06/07/19   Brock Bad, MD  ibuprofen (ADVIL) 800 MG tablet Take 1 tablet (800 mg total) by mouth every 8 (eight) hours as needed. 06/07/19   Brock Bad, MD  Multiple Vitamin (MULTIVITAMIN WITH MINERALS) TABS tablet Take 1 tablet by mouth daily.    [provider]  prenatal vitamin w/FE, FA (PRENATAL 1 + 1) 27-1 MG TABS tablet Take 1 tablet by mouth daily before breakfast. 06/07/19   Brock Bad, MD  valACYclovir (VALTREX) 500 MG tablet Take 1 tablet PO bid  x 3 days prn 06/07/19   Brock Bad, MD  vitamin C (ASCORBIC ACID) 250 MG tablet Take 250 mg by mouth daily.    [provider]    Allergies    Patient has no known allergies.  Review of Systems   Review of Systems  Constitutional:  Negative for chills and fever.  HENT:  Negative for ear pain and sore throat.   Eyes:  Negative for pain and visual disturbance.  Respiratory:  Negative for cough and shortness of breath.   Cardiovascular:  Negative for chest pain and palpitations.  Gastrointestinal:  Positive for abdominal pain, constipation, diarrhea and nausea. Negative for vomiting.  Genitourinary:  Negative for dysuria and hematuria.  Musculoskeletal:  Negative for arthralgias and back pain.  Skin:  Negative for color change and rash.  Neurological:  Negative for seizures and syncope.  All other  systems reviewed and are negative.  Physical Exam Updated Vital Signs BP 95/70 (BP Location: Left Arm)   Pulse 66   Temp 98.2 F (36.8 C) (Oral)   Resp 14   Ht 5\' 6"  (1.676 m)   Wt 78 kg   LMP 12/31/2020 (Approximate)   SpO2 99%   BMI 27.76 kg/m   Physical Exam Vitals and nursing note reviewed.  Constitutional:      General: She is not in acute distress.    Appearance: She is well-developed.  HENT:     Head: Normocephalic and atraumatic.  Eyes:     Conjunctiva/sclera: Conjunctivae normal.  Cardiovascular:     Rate and Rhythm: Normal rate and regular rhythm.     Heart sounds: No murmur heard. Pulmonary:     Effort: Pulmonary effort is normal. No respiratory distress.     Breath sounds: Normal breath sounds.  Abdominal:     Palpations: Abdomen is soft.     Tenderness: There is generalized abdominal tenderness.  Musculoskeletal:     Cervical back: Neck supple.  Skin:    General: Skin is warm and dry.  Neurological:     Mental Status: She is alert.    ED Results / Procedures / Treatments   Labs (all labs ordered are listed, but only abnormal results are displayed) Labs Reviewed  COMPREHENSIVE METABOLIC PANEL - Abnormal; Notable for the following components:      Result Value   Calcium 8.8 (*)    All other components within normal limits  CBC WITH DIFFERENTIAL/PLATELET  LIPASE, BLOOD  URINALYSIS, ROUTINE W REFLEX MICROSCOPIC  PREGNANCY, URINE    EKG None  Radiology CT Abdomen Pelvis W Contrast  Result Date: 01/22/2021 CLINICAL DATA:  Intermittent abdominal pain for several days, initial encounter EXAM: CT ABDOMEN AND PELVIS WITH CONTRAST TECHNIQUE: Multidetector CT imaging of the abdomen and pelvis was performed using the standard protocol following bolus administration of intravenous contrast. CONTRAST:  23mL OMNIPAQUE IOHEXOL 300 MG/ML  SOLN COMPARISON:  None. FINDINGS: Lower chest: No acute abnormality. Hepatobiliary: Mild fatty infiltration of the liver is  noted. The gallbladder is within normal limits. Pancreas: Unremarkable. No pancreatic ductal dilatation or surrounding inflammatory changes. Spleen: Normal in size without focal abnormality. Adrenals/Urinary Tract: Adrenal glands are within normal limits. Kidneys demonstrate a normal enhancement pattern. No calculi or obstructive changes are noted. The bladder is partially distended. Stomach/Bowel: Appendix is within normal limits. Colon shows no obstructive or inflammatory changes. Small bowel and stomach are within normal limits. Vascular/Lymphatic: No significant vascular findings are present. No enlarged abdominal or pelvic lymph nodes. Reproductive: Uterus is  within normal limits. Apparent involuting cyst is noted within the right ovary. Other: No abdominal wall hernia or abnormality. No abdominopelvic ascites. Musculoskeletal: No acute or significant osseous findings. IMPRESSION: Apparent involuting cyst within the right ovary. No complicating factors are seen. No evidence of diverticulitis. No other focal abnormality is seen. Electronically Signed   By: Alcide Clever M.D.   On: 01/22/2021 19:05    Procedures Procedures   Medications Ordered in ED Medications  iohexol (OMNIPAQUE) 300 MG/ML solution 75 mL (75 mLs Intravenous Contrast Given 01/22/21 1832)    ED Course  I have reviewed the triage vital signs and the nursing notes.  Pertinent labs & imaging results that were available during my care of the patient were reviewed by me and considered in my medical decision making (see chart for details).    MDM Rules/Calculators/A&P                          Liadan Guizar presents with diffuse abdominal pain and bloating.  Symptoms have been waxing and waning for several days.  She was evaluated here for evidence of emergent pathology such as biliary pathology, pancreatitis, intestinal obstruction, appendicitis.  ED work-up was reassuring.  Possible gastritis or mild colitis.  Symptoms are somewhat  resolved as of discharge.  The patient was instructed to follow-up with her primary care doctor, and a gastroenterology referral can be considered as needed. Final Clinical Impression(s) / ED Diagnoses Final diagnoses:  Generalized abdominal pain    Rx / DC Orders ED Discharge Orders     None        Koleen Distance, MD 01/22/21 [phone]

## 2021-01-25 ENCOUNTER — Telehealth: Payer: Self-pay

## 2021-01-25 NOTE — Telephone Encounter (Signed)
Transition Care Management Unsuccessful Follow-up Telephone Call  Date of discharge and from where:  01/22/2021 from [facility]  Attempts:  1st Attempt  Reason for unsuccessful TCM follow-up call:  Left voice message

## 2021-01-26 NOTE — Telephone Encounter (Signed)
Transition Care Management Unsuccessful Follow-up Telephone Call  Date of discharge and from where:  01/22/2021 from [facility]  Attempts:  2nd Attempt  Reason for unsuccessful TCM follow-up call:  Left voice message

## 2021-01-27 NOTE — Telephone Encounter (Signed)
Transition Care Management Follow-up Telephone Call Date of discharge and from where: 01/22/2021 from [facility] How have you been since you were released from the hospital? Pt states that she is feeling better.  Any questions or concerns? No  Items Reviewed: Did the pt receive and understand the discharge instructions provided? Yes  Medications obtained and verified? Yes  Other? No  Any new allergies since your discharge? No  Dietary orders reviewed? No Do you have support at home? Yes   Functional Questionnaire: (I = Independent and D = Dependent) ADLs: I  Bathing/Dressing- I  Meal Prep- I  Eating- I  Maintaining continence- I  Transferring/Ambulation- I  Managing Meds- I   Follow up appointments reviewed:  PCP Hospital f/u appt confirmed? No   Specialist Hospital f/u appt confirmed? Yes  Scheduled to see 01/29/2021 with Catalina Antigua, MD @ 01/29/2021. Are transportation arrangements needed? No  If their condition worsens, is the pt aware to call PCP or go to the Emergency Dept.? Yes Was the patient provided with contact information for the PCP's office or ED? Yes Was to pt encouraged to call back with questions or concerns? Yes

## 2021-01-29 ENCOUNTER — Encounter: Payer: Self-pay | Admitting: Obstetrics and Gynecology

## 2021-01-29 ENCOUNTER — Ambulatory Visit (INDEPENDENT_AMBULATORY_CARE_PROVIDER_SITE_OTHER): Payer: Medicaid Other | Admitting: Obstetrics and Gynecology

## 2021-01-29 ENCOUNTER — Other Ambulatory Visit: Payer: Self-pay

## 2021-01-29 VITALS — Wt 176.0 lb

## 2021-01-29 DIAGNOSIS — Z803 Family history of malignant neoplasm of breast: Secondary | ICD-10-CM | POA: Diagnosis not present

## 2021-01-29 NOTE — Progress Notes (Signed)
30 yo P1 presenting today for BRCA testing. Patient states that her mother was recently diagnosed with breast cancer she is 32. She also has a maternal aunt who was diagnosed with breast cancer in her 32's. Patient is without any complaints. She was seen in the ED on 6/10 where a CT scan showed an involuting cyst. Patient denies any pelvic pain or abnormal discharge. She reports a monthly period. She is sexually active using nuvaring for contraception  Past Medical History:  Diagnosis Date   Anemia    Herpes    Hypothyroidism    Postpartum care following vaginal delivery (1/25) 09/09/2013   Vaginal Pap smear, abnormal    Past Surgical History:  Procedure Laterality Date   TONSILLECTOMY     WISDOM TOOTH EXTRACTION      Family History  Problem Relation Age of Onset   Heart disease Maternal Grandmother    Cancer Paternal Grandmother    Cancer Maternal Aunt    Social History   Tobacco Use   Smoking status: Never   Smokeless tobacco: Never  Vaping Use   Vaping Use: Never used  Substance Use Topics   Alcohol use: Yes    Comment: ocasional   Drug use: No   ROS See pertinent in HPI. All other systems reviewed and non contributory  Weight 176 lb (79.8 kg), last menstrual period 12/31/2020. GENERAL: Well-developed, well-nourished female in no acute distress.  LUNGS: Clear to auscultation bilaterally.  HEART: Regular rate and rhythm. ABDOMEN: Soft, nontender, nondistended. No organomegaly. EXTREMITIES: No cyanosis, clubbing, or edema, 2+ distal pulses.  A/P 30 yo P1 with family history of breast cancer - Discussed benefits of BRCA testing given family history - Reassurance provided regarding involuting cyst - Patient had a normal phsical with labs and pap smears in 11/2020 - Patient declined STI testing - RTC prn

## 2021-01-29 NOTE — Progress Notes (Signed)
GYN presents for cyst on right ovary, and she wants to get the Bracca test done, Mother was recently dx with Breast Cancer.

## 2021-02-08 ENCOUNTER — Encounter: Payer: Self-pay | Admitting: Obstetrics and Gynecology

## 2021-03-01 ENCOUNTER — Emergency Department (HOSPITAL_BASED_OUTPATIENT_CLINIC_OR_DEPARTMENT_OTHER)
Admission: EM | Admit: 2021-03-01 | Discharge: 2021-03-01 | Disposition: A | Discharge status: Home / Self Care | Payer: Medicaid Other | Attending: Emergency Medicine | Admitting: Emergency Medicine

## 2021-03-01 ENCOUNTER — Encounter (HOSPITAL_BASED_OUTPATIENT_CLINIC_OR_DEPARTMENT_OTHER): Payer: Self-pay | Admitting: *Deleted

## 2021-03-01 ENCOUNTER — Other Ambulatory Visit: Payer: Self-pay

## 2021-03-01 DIAGNOSIS — R10816 Epigastric abdominal tenderness: Secondary | ICD-10-CM | POA: Diagnosis not present

## 2021-03-01 DIAGNOSIS — E039 Hypothyroidism, unspecified: Secondary | ICD-10-CM | POA: Diagnosis not present

## 2021-03-01 DIAGNOSIS — X58XXXA Exposure to other specified factors, initial encounter: Secondary | ICD-10-CM | POA: Diagnosis not present

## 2021-03-01 DIAGNOSIS — M79605 Pain in left leg: Secondary | ICD-10-CM | POA: Diagnosis not present

## 2021-03-01 DIAGNOSIS — M79604 Pain in right leg: Secondary | ICD-10-CM | POA: Insufficient documentation

## 2021-03-01 DIAGNOSIS — S3992XA Unspecified injury of lower back, initial encounter: Secondary | ICD-10-CM | POA: Diagnosis present

## 2021-03-01 DIAGNOSIS — S39012A Strain of muscle, fascia and tendon of lower back, initial encounter: Secondary | ICD-10-CM | POA: Diagnosis not present

## 2021-03-01 LAB — COMPREHENSIVE METABOLIC PANEL
ALT: 25 U/L (ref 0–44)
AST: 14 U/L — ABNORMAL LOW (ref 15–41)
Albumin: 3.8 g/dL (ref 3.5–5.0)
Alkaline Phosphatase: 55 U/L (ref 38–126)
Anion gap: 7 (ref 5–15)
BUN: 10 mg/dL (ref 6–20)
CO2: 23 mmol/L (ref 22–32)
Calcium: 8.4 mg/dL — ABNORMAL LOW (ref 8.9–10.3)
Chloride: 107 mmol/L (ref 98–111)
Creatinine, Ser: 0.88 mg/dL (ref 0.44–1.00)
GFR, Estimated: >60 mL/min (ref >60)
Glucose, Bld: 85 mg/dL (ref 70–99)
Potassium: 4.2 mmol/L (ref 3.5–5.1)
Sodium: 137 mmol/L (ref 135–145)
Total Bilirubin: 0.3 mg/dL (ref 0.3–1.2)
Total Protein: 6.4 g/dL — ABNORMAL LOW (ref 6.5–8.1)

## 2021-03-01 LAB — CBC WITH DIFFERENTIAL/PLATELET
Abs Immature Granulocytes: 0.01 10*3/uL (ref 0.00–0.07)
Basophils Absolute: 0.1 10*3/uL (ref 0.0–0.1)
Basophils Relative: 1 %
Eosinophils Absolute: 0.1 10*3/uL (ref 0.0–0.5)
Eosinophils Relative: 1 %
HCT: 39.7 % (ref 36.0–46.0)
Hemoglobin: 13.1 g/dL (ref 12.0–15.0)
Immature Granulocytes: 0 %
Lymphocytes Relative: 30 %
Lymphs Abs: 1.9 10*3/uL (ref 0.7–4.0)
MCH: 30.5 pg (ref 26.0–34.0)
MCHC: 33 g/dL (ref 30.0–36.0)
MCV: 92.3 fL (ref 80.0–100.0)
Monocytes Absolute: 0.4 10*3/uL (ref 0.1–1.0)
Monocytes Relative: 6 %
Neutro Abs: 3.9 10*3/uL (ref 1.7–7.7)
Neutrophils Relative %: 62 %
Platelets: 286 10*3/uL (ref 150–400)
RBC: 4.3 MIL/uL (ref 3.87–5.11)
RDW: 13.3 % (ref 11.5–15.5)
WBC: 6.3 10*3/uL (ref 4.0–10.5)
nRBC: 0 % (ref 0.0–0.2)

## 2021-03-01 LAB — URINALYSIS, ROUTINE W REFLEX MICROSCOPIC
Bilirubin Urine: NEGATIVE
Glucose, UA: NEGATIVE mg/dL
Hgb urine dipstick: NEGATIVE
Ketones, ur: NEGATIVE mg/dL
Leukocytes,Ua: NEGATIVE
Nitrite: NEGATIVE
Specific Gravity, Urine: 1.032 — ABNORMAL HIGH (ref 1.005–1.030)
pH: 5.5 (ref 5.0–8.0)

## 2021-03-01 LAB — PREGNANCY, URINE: Preg Test, Ur: NEGATIVE

## 2021-03-01 LAB — LIPASE, BLOOD: Lipase: 24 U/L (ref 11–51)

## 2021-03-01 MED ORDER — PREDNISONE 10 MG (21) PO TBPK: ORAL_TABLET | Freq: Every day | ORAL | 0 refills | Status: AC

## 2021-03-01 MED ORDER — SODIUM CHLORIDE 0.9 % IV BOLUS
1000.0000 mL | Freq: Once | INTRAVENOUS | Status: AC
  Administered 2021-03-01: 1000 mL via INTRAVENOUS

## 2021-03-01 MED ORDER — MORPHINE SULFATE (PF) 4 MG/ML IV SOLN
4.0000 mg | Freq: Once | INTRAVENOUS | Status: AC
  Administered 2021-03-01: 4 mg via INTRAVENOUS
  Filled 2021-03-01: qty 1

## 2021-03-01 MED ORDER — ONDANSETRON HCL 4 MG/2ML IJ SOLN
4.0000 mg | Freq: Once | INTRAMUSCULAR | Status: AC
Start: 2021-03-01 — End: 2021-03-01
  Administered 2021-03-01: 4 mg via INTRAVENOUS
  Filled 2021-03-01: qty 2

## 2021-03-01 MED ORDER — METHOCARBAMOL 500 MG PO TABS: 500.0000 mg | ORAL_TABLET | Freq: Two times a day (BID) | ORAL | 0 refills | Status: DC

## 2021-03-01 NOTE — ED Notes (Signed)
Patient verbalizes understanding of discharge instructions. Opportunity for questioning and answers were provided. Patient discharged from ED.  °

## 2021-03-01 NOTE — ED Provider Notes (Signed)
[facility] EMERGENCY DEPT Provider Note   CSN: [id number] Arrival date & time: 03/01/21  1130     History Chief Complaint  Patient presents with   Back Pain    [name] is a 30 y.o. female.  Pt presents to the ED today with low back pain.  Pt said it's been going on for 2 days.  She has been taking Naprosyn and Ibuprofen without relief.  Pt said pain radiates down both legs.  She also has some abdominal pain.      Past Medical History:  Diagnosis Date   Anemia    Herpes    Hypothyroidism    Postpartum care following vaginal delivery (1/25) 09/09/2013   Vaginal Pap smear, abnormal     Patient Active Problem List   Diagnosis Date Noted   Postpartum care following vaginal delivery (1/25) 09/09/2013   Normal labor 09/08/2013    Past Surgical History:  Procedure Laterality Date   TONSILLECTOMY     WISDOM TOOTH EXTRACTION       OB History     Gravida  1   Para  1   Term  1   Preterm      AB      Living  1      SAB      IAB      Ectopic      Multiple      Live Births  1           Family History  Problem Relation Age of Onset   Heart disease Maternal Grandmother    Cancer Paternal Grandmother    Cancer Maternal Aunt     Social History   Tobacco Use   Smoking status: Never   Smokeless tobacco: Never  Vaping Use   Vaping Use: Never used  Substance Use Topics   Alcohol use: Yes    Comment: ocasional   Drug use: No    Home Medications Prior to Admission medications   Medication Sig Start Date End Date Taking? Authorizing Provider  ibuprofen (ADVIL) 800 MG tablet Take 1 tablet (800 mg total) by mouth every 8 (eight) hours as needed. 06/07/19  Yes Brock Bad, MD  methocarbamol (ROBAXIN) 500 MG tablet Take 1 tablet (500 mg total) by mouth 2 (two) times daily. 03/01/21  Yes Jacalyn Lefevre, MD  Multiple Vitamin (MULTIVITAMIN WITH MINERALS) TABS tablet Take 1 tablet by mouth daily.   Yes [provider]  predniSONE (STERAPRED UNI-PAK 21 TAB) 10 MG (21) TBPK tablet Take by mouth daily. Take 6 tabs by mouth daily  for 2 days, then 5 tabs for 2 days, then 4 tabs for 2 days, then 3 tabs for 2 days, 2 tabs for 2 days, then 1 tab by mouth daily for 2 days 03/01/21  Yes Jacalyn Lefevre, MD  vitamin C (ASCORBIC ACID) 250 MG tablet Take 250 mg by mouth daily.   Yes [provider]  amoxicillin-clavulanate (AUGMENTIN) 875-125 MG tablet Take 1 tablet by mouth 2 (two) times daily. 1 tablet po BID x10 days Patient not taking: Reported on 01/29/2021 06/07/19   Brock Bad, MD  etonogestrel-ethinyl estradiol (NUVARING) 0.12-0.015 MG/24HR vaginal ring Insert vaginally and leave in place for 3 consecutive weeks, then remove for 1 week. 06/07/19   Brock Bad, MD  prenatal vitamin w/FE, FA (PRENATAL 1 + 1) 27-1 MG TABS tablet Take 1 tablet by mouth daily before breakfast. 06/07/19   Brock Bad, MD  valACYclovir (VALTREX) 500 MG tablet Take 1 tablet PO bid x 3 days prn 06/07/19   Brock Bad, MD    Allergies    Tylenol [acetaminophen]  Review of Systems   Review of Systems  Gastrointestinal:  Positive for abdominal pain.  Musculoskeletal:  Positive for back pain.  All other systems reviewed and are negative.  Physical Exam Updated Vital Signs BP 109/75 (BP Location: Right Arm)   Pulse 77   Temp 98.4 F (36.9 C) (Oral)   Resp 16   Ht 5' 6.5" (1.689 m)   Wt 77.1 kg   SpO2 100%   BMI 27.03 kg/m   Physical Exam Vitals and nursing note reviewed.  Constitutional:      Appearance: Normal appearance.  HENT:     Head: Normocephalic and atraumatic.     Right Ear: External ear normal.     Left Ear: External ear normal.     Nose: Nose normal.     Mouth/Throat:     Mouth: Mucous membranes are moist.     Pharynx: Oropharynx is clear.  Eyes:     Extraocular Movements: Extraocular movements intact.     Conjunctiva/sclera: Conjunctivae normal.     Pupils: Pupils are  equal, round, and reactive to light.  Cardiovascular:     Rate and Rhythm: Normal rate and regular rhythm.     Pulses: Normal pulses.     Heart sounds: Normal heart sounds.  Pulmonary:     Effort: Pulmonary effort is normal.     Breath sounds: Normal breath sounds.  Abdominal:     General: Abdomen is flat. Bowel sounds are normal.     Palpations: Abdomen is soft.     Tenderness: There is abdominal tenderness in the epigastric area.  Musculoskeletal:        General: Normal range of motion.     Cervical back: Normal range of motion and neck supple.     Lumbar back: Spasms and tenderness present. No swelling or deformity.  Skin:    General: Skin is warm.     Capillary Refill: Capillary refill takes less than 2 seconds.  Neurological:     General: No focal deficit present.     Mental Status: She is alert and oriented to person, place, and time.  Psychiatric:        Mood and Affect: Mood normal.        Behavior: Behavior normal.        Thought Content: Thought content normal.        Judgment: Judgment normal.    ED Results / Procedures / Treatments   Labs (all labs ordered are listed, but only abnormal results are displayed) Labs Reviewed  URINALYSIS, ROUTINE W REFLEX MICROSCOPIC - Abnormal; Notable for the following components:      Result Value   Specific Gravity, Urine 1.032 (*)    Protein, ur TRACE (*)    All other components within normal limits  COMPREHENSIVE METABOLIC PANEL - Abnormal; Notable for the following components:   Calcium 8.4 (*)    Total Protein 6.4 (*)    AST 14 (*)    All other components within normal limits  PREGNANCY, URINE  CBC WITH DIFFERENTIAL/PLATELET  LIPASE, BLOOD    EKG None  Radiology No results found.  Procedures Procedures   Medications Ordered in ED Medications  sodium chloride 0.9 % bolus 1,000 mL (0 mLs Intravenous Stopped 03/01/21 1446)  morphine 4 MG/ML injection 4 mg (4 mg Intravenous Given  03/01/21 1331)  ondansetron  (ZOFRAN) injection 4 mg (4 mg Intravenous Given 03/01/21 1332)    ED Course  I have reviewed the triage vital signs and the nursing notes.  Pertinent labs & imaging results that were available during my care of the patient were reviewed by me and considered in my medical decision making (see chart for details).    MDM Rules/Calculators/A&P                          Pt's labs are nl.  Pt is stable for d/c.  She is to return if worse. Final Clinical Impression(s) / ED Diagnoses Final diagnoses:  Strain of lumbar region, initial encounter    Rx / DC Orders ED Discharge Orders          Ordered    methocarbamol (ROBAXIN) 500 MG tablet  2 times daily        03/01/21 1434    predniSONE (STERAPRED UNI-PAK 21 TAB) 10 MG (21) TBPK tablet  Daily        03/01/21 1434             Jacalyn Lefevre, MD 03/01/21 1450

## 2021-03-01 NOTE — ED Notes (Signed)
Pt placed on bedpan

## 2021-03-01 NOTE — ED Triage Notes (Signed)
2 days of low back pain with increased pain sitting and walking, no injury, no problems/symptoms with B&B

## 2021-03-02 ENCOUNTER — Telehealth: Payer: Self-pay

## 2021-03-02 NOTE — Telephone Encounter (Signed)
Transition Care Management Unsuccessful Follow-up Telephone Call  Date of discharge and from where:  03/01/2021-[facility]  Attempts:  1st Attempt  Reason for unsuccessful TCM follow-up call:  Left voice message

## 2021-03-03 NOTE — Telephone Encounter (Signed)
Transition Care Management Unsuccessful Follow-up Telephone Call  Date of discharge and from where:  03/01/2021-[facility]  Attempts:  2nd Attempt  Reason for unsuccessful TCM follow-up call:  Left voice message

## 2021-03-04 NOTE — Telephone Encounter (Signed)
Transition Care Management Unsuccessful Follow-up Telephone Call  Date of discharge and from where:  03/03/2021-[facility]  Attempts:  3rd Attempt  Reason for unsuccessful TCM follow-up call:  Left voice message

## 2022-01-09 ENCOUNTER — Encounter (HOSPITAL_BASED_OUTPATIENT_CLINIC_OR_DEPARTMENT_OTHER): Payer: Self-pay | Admitting: Emergency Medicine

## 2022-01-09 ENCOUNTER — Other Ambulatory Visit: Payer: Self-pay

## 2022-01-09 ENCOUNTER — Emergency Department (HOSPITAL_BASED_OUTPATIENT_CLINIC_OR_DEPARTMENT_OTHER): Payer: BC Managed Care – PPO

## 2022-01-09 ENCOUNTER — Emergency Department (HOSPITAL_BASED_OUTPATIENT_CLINIC_OR_DEPARTMENT_OTHER)
Admission: EM | Admit: 2022-01-09 | Discharge: 2022-01-09 | Disposition: A | Discharge status: Home / Self Care | Payer: BC Managed Care – PPO | Attending: Emergency Medicine | Admitting: Emergency Medicine

## 2022-01-09 DIAGNOSIS — R519 Headache, unspecified: Secondary | ICD-10-CM | POA: Diagnosis not present

## 2022-01-09 DIAGNOSIS — M25561 Pain in right knee: Secondary | ICD-10-CM | POA: Diagnosis not present

## 2022-01-09 DIAGNOSIS — R0789 Other chest pain: Secondary | ICD-10-CM | POA: Diagnosis not present

## 2022-01-09 DIAGNOSIS — M25551 Pain in right hip: Secondary | ICD-10-CM | POA: Diagnosis not present

## 2022-01-09 DIAGNOSIS — M542 Cervicalgia: Secondary | ICD-10-CM | POA: Insufficient documentation

## 2022-01-09 DIAGNOSIS — M79662 Pain in left lower leg: Secondary | ICD-10-CM | POA: Diagnosis not present

## 2022-01-09 DIAGNOSIS — M79643 Pain in unspecified hand: Secondary | ICD-10-CM | POA: Diagnosis not present

## 2022-01-09 DIAGNOSIS — M79661 Pain in right lower leg: Secondary | ICD-10-CM | POA: Diagnosis not present

## 2022-01-09 DIAGNOSIS — M549 Dorsalgia, unspecified: Secondary | ICD-10-CM | POA: Insufficient documentation

## 2022-01-09 DIAGNOSIS — T07XXXA Unspecified multiple injuries, initial encounter: Secondary | ICD-10-CM | POA: Diagnosis not present

## 2022-01-09 DIAGNOSIS — S80919A Unspecified superficial injury of unspecified knee, initial encounter: Secondary | ICD-10-CM | POA: Diagnosis not present

## 2022-01-09 MED ORDER — IBUPROFEN 800 MG PO TABS
800.0000 mg | ORAL_TABLET | Freq: Once | ORAL | Status: AC
  Administered 2022-01-09: 800 mg via ORAL
  Filled 2022-01-09: qty 1

## 2022-01-09 MED ORDER — METHOCARBAMOL 500 MG PO TABS: 500.0000 mg | ORAL_TABLET | Freq: Two times a day (BID) | ORAL | 0 refills | Status: AC

## 2022-01-09 MED ORDER — METHOCARBAMOL 500 MG PO TABS
500.0000 mg | ORAL_TABLET | Freq: Once | ORAL | Status: AC
  Administered 2022-01-09: 500 mg via ORAL
  Filled 2022-01-09: qty 1
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
## 2022-01-09 NOTE — ED Notes (Signed)
Patient transported to X-ray 
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
## 2022-01-11 ENCOUNTER — Telehealth: Payer: Self-pay

## 2022-01-11 NOTE — Telephone Encounter (Signed)
Transition Care Management Unsuccessful Follow-up Telephone Call  Date of discharge and from where:  01/09/2022-[facility]  Attempts:  1st Attempt  Reason for unsuccessful TCM follow-up call:  Unable to reach patient

## 2022-01-12 NOTE — Telephone Encounter (Signed)
Transition Care Management Unsuccessful Follow-up Telephone Call  Date of discharge and from where:  01/09/2022-[facility]  Attempts:  2nd Attempt  Reason for unsuccessful TCM follow-up call:  Unable to leave message

## 2022-01-13 NOTE — Telephone Encounter (Signed)
Transition Care Management Unsuccessful Follow-up Telephone Call  Date of discharge and from where:  01/09/2022-[facility]  Attempts:  3rd Attempt  Reason for unsuccessful TCM follow-up call:  Unable to leave message

## 2022-01-14 DIAGNOSIS — M545 Low back pain, unspecified: Secondary | ICD-10-CM | POA: Diagnosis not present

## 2022-01-14 DIAGNOSIS — G8911 Acute pain due to trauma: Secondary | ICD-10-CM | POA: Diagnosis not present

## 2022-01-14 DIAGNOSIS — M546 Pain in thoracic spine: Secondary | ICD-10-CM | POA: Diagnosis not present

## 2022-01-14 DIAGNOSIS — M542 Cervicalgia: Secondary | ICD-10-CM | POA: Diagnosis not present

## 2022-01-19 DIAGNOSIS — R600 Localized edema: Secondary | ICD-10-CM | POA: Diagnosis not present

## 2022-01-19 DIAGNOSIS — M25512 Pain in left shoulder: Secondary | ICD-10-CM | POA: Diagnosis not present

## 2022-01-19 DIAGNOSIS — M25562 Pain in left knee: Secondary | ICD-10-CM | POA: Diagnosis not present

## 2022-01-19 DIAGNOSIS — M25561 Pain in right knee: Secondary | ICD-10-CM | POA: Diagnosis not present

## 2022-01-20 DIAGNOSIS — S8011XA Contusion of right lower leg, initial encounter: Secondary | ICD-10-CM | POA: Diagnosis not present

## 2022-01-20 DIAGNOSIS — S8012XA Contusion of left lower leg, initial encounter: Secondary | ICD-10-CM | POA: Diagnosis not present

## 2022-01-21 DIAGNOSIS — M25512 Pain in left shoulder: Secondary | ICD-10-CM | POA: Diagnosis not present

## 2022-01-21 DIAGNOSIS — M545 Low back pain, unspecified: Secondary | ICD-10-CM | POA: Diagnosis not present

## 2022-01-21 DIAGNOSIS — M25562 Pain in left knee: Secondary | ICD-10-CM | POA: Diagnosis not present

## 2022-01-21 DIAGNOSIS — Z041 Encounter for examination and observation following transport accident: Secondary | ICD-10-CM | POA: Diagnosis not present

## 2022-01-21 DIAGNOSIS — S199XXA Unspecified injury of neck, initial encounter: Secondary | ICD-10-CM | POA: Diagnosis not present

## 2022-01-21 DIAGNOSIS — M25561 Pain in right knee: Secondary | ICD-10-CM | POA: Diagnosis not present

## 2022-02-04 DIAGNOSIS — M546 Pain in thoracic spine: Secondary | ICD-10-CM | POA: Diagnosis not present

## 2022-02-04 DIAGNOSIS — M542 Cervicalgia: Secondary | ICD-10-CM | POA: Diagnosis not present

## 2022-02-08 DIAGNOSIS — M25562 Pain in left knee: Secondary | ICD-10-CM | POA: Diagnosis not present

## 2022-02-08 DIAGNOSIS — M25512 Pain in left shoulder: Secondary | ICD-10-CM | POA: Diagnosis not present

## 2022-02-08 DIAGNOSIS — M546 Pain in thoracic spine: Secondary | ICD-10-CM | POA: Diagnosis not present

## 2022-02-08 DIAGNOSIS — R600 Localized edema: Secondary | ICD-10-CM | POA: Diagnosis not present

## 2022-02-08 DIAGNOSIS — M542 Cervicalgia: Secondary | ICD-10-CM | POA: Diagnosis not present

## 2022-02-08 DIAGNOSIS — M25561 Pain in right knee: Secondary | ICD-10-CM | POA: Diagnosis not present

## 2022-02-08 DIAGNOSIS — M25511 Pain in right shoulder: Secondary | ICD-10-CM | POA: Diagnosis not present

## 2022-02-10 DIAGNOSIS — M542 Cervicalgia: Secondary | ICD-10-CM | POA: Diagnosis not present

## 2022-02-10 DIAGNOSIS — M25512 Pain in left shoulder: Secondary | ICD-10-CM | POA: Diagnosis not present

## 2022-02-10 DIAGNOSIS — M546 Pain in thoracic spine: Secondary | ICD-10-CM | POA: Diagnosis not present

## 2022-02-10 DIAGNOSIS — R600 Localized edema: Secondary | ICD-10-CM | POA: Diagnosis not present

## 2022-02-10 DIAGNOSIS — M25511 Pain in right shoulder: Secondary | ICD-10-CM | POA: Diagnosis not present

## 2022-02-10 DIAGNOSIS — M25561 Pain in right knee: Secondary | ICD-10-CM | POA: Diagnosis not present

## 2022-02-10 DIAGNOSIS — M25562 Pain in left knee: Secondary | ICD-10-CM | POA: Diagnosis not present

## 2022-02-11 DIAGNOSIS — M545 Low back pain, unspecified: Secondary | ICD-10-CM | POA: Diagnosis not present

## 2022-02-11 DIAGNOSIS — M25562 Pain in left knee: Secondary | ICD-10-CM | POA: Diagnosis not present

## 2022-02-11 DIAGNOSIS — M542 Cervicalgia: Secondary | ICD-10-CM | POA: Diagnosis not present

## 2022-02-11 DIAGNOSIS — M25561 Pain in right knee: Secondary | ICD-10-CM | POA: Diagnosis not present

## 2022-02-11 DIAGNOSIS — M25512 Pain in left shoulder: Secondary | ICD-10-CM | POA: Diagnosis not present

## 2022-02-11 DIAGNOSIS — G8911 Acute pain due to trauma: Secondary | ICD-10-CM | POA: Diagnosis not present

## 2022-02-11 DIAGNOSIS — M546 Pain in thoracic spine: Secondary | ICD-10-CM | POA: Diagnosis not present

## 2022-02-11 DIAGNOSIS — M25511 Pain in right shoulder: Secondary | ICD-10-CM | POA: Diagnosis not present

## 2022-02-16 DIAGNOSIS — M25511 Pain in right shoulder: Secondary | ICD-10-CM | POA: Diagnosis not present

## 2022-02-16 DIAGNOSIS — M542 Cervicalgia: Secondary | ICD-10-CM | POA: Diagnosis not present

## 2022-02-16 DIAGNOSIS — M25561 Pain in right knee: Secondary | ICD-10-CM | POA: Diagnosis not present

## 2022-02-16 DIAGNOSIS — M546 Pain in thoracic spine: Secondary | ICD-10-CM | POA: Diagnosis not present

## 2022-02-16 DIAGNOSIS — M25512 Pain in left shoulder: Secondary | ICD-10-CM | POA: Diagnosis not present

## 2022-02-16 DIAGNOSIS — R600 Localized edema: Secondary | ICD-10-CM | POA: Diagnosis not present

## 2022-02-16 DIAGNOSIS — M25562 Pain in left knee: Secondary | ICD-10-CM | POA: Diagnosis not present

## 2022-02-21 DIAGNOSIS — M25511 Pain in right shoulder: Secondary | ICD-10-CM | POA: Diagnosis not present

## 2022-02-21 DIAGNOSIS — M542 Cervicalgia: Secondary | ICD-10-CM | POA: Diagnosis not present

## 2022-02-21 DIAGNOSIS — M546 Pain in thoracic spine: Secondary | ICD-10-CM | POA: Diagnosis not present

## 2022-02-21 DIAGNOSIS — R600 Localized edema: Secondary | ICD-10-CM | POA: Diagnosis not present

## 2022-02-21 DIAGNOSIS — M25561 Pain in right knee: Secondary | ICD-10-CM | POA: Diagnosis not present

## 2022-02-21 DIAGNOSIS — M25512 Pain in left shoulder: Secondary | ICD-10-CM | POA: Diagnosis not present

## 2022-02-21 DIAGNOSIS — M25562 Pain in left knee: Secondary | ICD-10-CM | POA: Diagnosis not present

## 2022-02-25 DIAGNOSIS — G4486 Cervicogenic headache: Secondary | ICD-10-CM | POA: Diagnosis not present

## 2022-02-25 DIAGNOSIS — M542 Cervicalgia: Secondary | ICD-10-CM | POA: Diagnosis not present

## 2022-02-28 DIAGNOSIS — M25511 Pain in right shoulder: Secondary | ICD-10-CM | POA: Diagnosis not present

## 2022-02-28 DIAGNOSIS — M25512 Pain in left shoulder: Secondary | ICD-10-CM | POA: Diagnosis not present

## 2022-02-28 DIAGNOSIS — M546 Pain in thoracic spine: Secondary | ICD-10-CM | POA: Diagnosis not present

## 2022-02-28 DIAGNOSIS — M25562 Pain in left knee: Secondary | ICD-10-CM | POA: Diagnosis not present

## 2022-02-28 DIAGNOSIS — M25561 Pain in right knee: Secondary | ICD-10-CM | POA: Diagnosis not present

## 2022-02-28 DIAGNOSIS — R600 Localized edema: Secondary | ICD-10-CM | POA: Diagnosis not present

## 2022-02-28 DIAGNOSIS — M542 Cervicalgia: Secondary | ICD-10-CM | POA: Diagnosis not present

## 2022-03-25 DIAGNOSIS — G935 Compression of brain: Secondary | ICD-10-CM | POA: Diagnosis not present

## 2022-03-25 DIAGNOSIS — G44321 Chronic post-traumatic headache, intractable: Secondary | ICD-10-CM | POA: Diagnosis not present

## 2022-03-25 DIAGNOSIS — M542 Cervicalgia: Secondary | ICD-10-CM | POA: Diagnosis not present

## 2022-04-08 DIAGNOSIS — R600 Localized edema: Secondary | ICD-10-CM | POA: Diagnosis not present

## 2022-04-08 DIAGNOSIS — M546 Pain in thoracic spine: Secondary | ICD-10-CM | POA: Diagnosis not present

## 2022-04-08 DIAGNOSIS — M25562 Pain in left knee: Secondary | ICD-10-CM | POA: Diagnosis not present

## 2022-04-08 DIAGNOSIS — M25512 Pain in left shoulder: Secondary | ICD-10-CM | POA: Diagnosis not present

## 2022-04-08 DIAGNOSIS — M25561 Pain in right knee: Secondary | ICD-10-CM | POA: Diagnosis not present

## 2022-04-08 DIAGNOSIS — M25511 Pain in right shoulder: Secondary | ICD-10-CM | POA: Diagnosis not present

## 2022-04-08 DIAGNOSIS — M542 Cervicalgia: Secondary | ICD-10-CM | POA: Diagnosis not present

## 2022-04-15 DIAGNOSIS — M25511 Pain in right shoulder: Secondary | ICD-10-CM | POA: Diagnosis not present

## 2022-04-15 DIAGNOSIS — M25561 Pain in right knee: Secondary | ICD-10-CM | POA: Diagnosis not present

## 2022-04-15 DIAGNOSIS — R600 Localized edema: Secondary | ICD-10-CM | POA: Diagnosis not present

## 2022-04-15 DIAGNOSIS — M542 Cervicalgia: Secondary | ICD-10-CM | POA: Diagnosis not present

## 2022-04-15 DIAGNOSIS — M25512 Pain in left shoulder: Secondary | ICD-10-CM | POA: Diagnosis not present

## 2022-04-15 DIAGNOSIS — M546 Pain in thoracic spine: Secondary | ICD-10-CM | POA: Diagnosis not present

## 2022-04-15 DIAGNOSIS — M25562 Pain in left knee: Secondary | ICD-10-CM | POA: Diagnosis not present

## 2022-04-21 DIAGNOSIS — M542 Cervicalgia: Secondary | ICD-10-CM | POA: Diagnosis not present

## 2022-04-21 DIAGNOSIS — M25512 Pain in left shoulder: Secondary | ICD-10-CM | POA: Diagnosis not present

## 2022-04-21 DIAGNOSIS — M546 Pain in thoracic spine: Secondary | ICD-10-CM | POA: Diagnosis not present

## 2022-04-21 DIAGNOSIS — R600 Localized edema: Secondary | ICD-10-CM | POA: Diagnosis not present

## 2022-04-21 DIAGNOSIS — M25562 Pain in left knee: Secondary | ICD-10-CM | POA: Diagnosis not present

## 2022-04-21 DIAGNOSIS — M25561 Pain in right knee: Secondary | ICD-10-CM | POA: Diagnosis not present

## 2022-04-21 DIAGNOSIS — M25511 Pain in right shoulder: Secondary | ICD-10-CM | POA: Diagnosis not present

## 2022-05-11 DIAGNOSIS — G43009 Migraine without aura, not intractable, without status migrainosus: Secondary | ICD-10-CM | POA: Diagnosis not present

## 2022-05-11 DIAGNOSIS — M5481 Occipital neuralgia: Secondary | ICD-10-CM | POA: Diagnosis not present

## 2022-05-11 DIAGNOSIS — G44329 Chronic post-traumatic headache, not intractable: Secondary | ICD-10-CM | POA: Diagnosis not present

## 2022-05-23 DIAGNOSIS — Z87820 Personal history of traumatic brain injury: Secondary | ICD-10-CM | POA: Diagnosis not present

## 2022-05-23 DIAGNOSIS — G935 Compression of brain: Secondary | ICD-10-CM | POA: Diagnosis not present

## 2022-07-27 DIAGNOSIS — G44309 Post-traumatic headache, unspecified, not intractable: Secondary | ICD-10-CM | POA: Diagnosis not present

## 2022-07-27 DIAGNOSIS — M542 Cervicalgia: Secondary | ICD-10-CM | POA: Diagnosis not present

## 2022-09-05 DIAGNOSIS — G935 Compression of brain: Secondary | ICD-10-CM | POA: Diagnosis not present

## 2022-10-18 ENCOUNTER — Telehealth: Payer: Self-pay | Admitting: *Deleted

## 2022-10-18 NOTE — Telephone Encounter (Signed)
Pt called to reset password for MyChart.  RNCM assisted with changing/updating password.

## 2023-09-29 DIAGNOSIS — S8001XA Contusion of right knee, initial encounter: Secondary | ICD-10-CM | POA: Diagnosis not present

## 2023-09-29 DIAGNOSIS — W541XXA Struck by dog, initial encounter: Secondary | ICD-10-CM | POA: Diagnosis not present
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
## 2024-04-12 DIAGNOSIS — N644 Mastodynia: Secondary | ICD-10-CM | POA: Diagnosis not present

## 2024-04-12 DIAGNOSIS — M542 Cervicalgia: Secondary | ICD-10-CM | POA: Diagnosis not present

## 2024-04-12 DIAGNOSIS — R1032 Left lower quadrant pain: Secondary | ICD-10-CM | POA: Diagnosis not present

## 2024-04-12 DIAGNOSIS — Z13220 Encounter for screening for lipoid disorders: Secondary | ICD-10-CM | POA: Diagnosis not present

## 2024-04-12 DIAGNOSIS — E538 Deficiency of other specified B group vitamins: Secondary | ICD-10-CM | POA: Diagnosis not present

## 2024-04-12 DIAGNOSIS — R350 Frequency of micturition: Secondary | ICD-10-CM | POA: Diagnosis not present

## 2024-04-12 DIAGNOSIS — E559 Vitamin D deficiency, unspecified: Secondary | ICD-10-CM | POA: Diagnosis not present

## 2024-04-24 DIAGNOSIS — R102 Pelvic and perineal pain: Secondary | ICD-10-CM | POA: Diagnosis not present

## 2024-04-24 DIAGNOSIS — R319 Hematuria, unspecified: Secondary | ICD-10-CM | POA: Diagnosis not present

## 2024-04-24 DIAGNOSIS — M545 Low back pain, unspecified: Secondary | ICD-10-CM | POA: Diagnosis not present

## 2024-04-24 DIAGNOSIS — L52 Erythema nodosum: Secondary | ICD-10-CM | POA: Diagnosis not present

## 2024-04-24 DIAGNOSIS — R1031 Right lower quadrant pain: Secondary | ICD-10-CM | POA: Diagnosis not present

## 2024-04-30 DIAGNOSIS — D252 Subserosal leiomyoma of uterus: Secondary | ICD-10-CM | POA: Diagnosis not present

## 2024-04-30 DIAGNOSIS — D251 Intramural leiomyoma of uterus: Secondary | ICD-10-CM | POA: Diagnosis not present
